# Patient Record
Sex: Female | Born: 1980 | Hispanic: Yes | Marital: Married | Smoking: Never smoker
Health system: Southern US, Community
[De-identification: ages and names within clinical notes are randomized; demographics above are authoritative.]

## PROBLEM LIST (undated history)

## (undated) ENCOUNTER — Inpatient Hospital Stay (HOSPITAL_COMMUNITY): Payer: Self-pay

## (undated) DIAGNOSIS — O24419 Gestational diabetes mellitus in pregnancy, unspecified control: Secondary | ICD-10-CM

## (undated) DIAGNOSIS — R7303 Prediabetes: Secondary | ICD-10-CM

## (undated) DIAGNOSIS — G43909 Migraine, unspecified, not intractable, without status migrainosus: Secondary | ICD-10-CM

## (undated) HISTORY — PX: NO PAST SURGERIES: SHX2092

## (undated) HISTORY — DX: Migraine, unspecified, not intractable, without status migrainosus: G43.909

## (undated) HISTORY — DX: Gestational diabetes mellitus in pregnancy, unspecified control: O24.419

---

## 2001-07-16 ENCOUNTER — Emergency Department (HOSPITAL_COMMUNITY): Admission: EM | Admit: 2001-07-16 | Discharge: 2001-07-16 | Payer: Self-pay | Admitting: Emergency Medicine

## 2001-07-16 ENCOUNTER — Encounter: Payer: Self-pay | Admitting: Emergency Medicine

## 2001-07-22 ENCOUNTER — Emergency Department (HOSPITAL_COMMUNITY): Admission: EM | Admit: 2001-07-22 | Discharge: 2001-07-22 | Payer: Self-pay | Admitting: Emergency Medicine

## 2001-08-14 ENCOUNTER — Other Ambulatory Visit: Admission: RE | Admit: 2001-08-14 | Discharge: 2001-08-14 | Payer: Self-pay | Admitting: Obstetrics and Gynecology

## 2002-08-02 ENCOUNTER — Emergency Department (HOSPITAL_COMMUNITY): Admission: EM | Admit: 2002-08-02 | Discharge: 2002-08-03 | Payer: Self-pay

## 2002-08-03 ENCOUNTER — Encounter: Payer: Self-pay | Admitting: Emergency Medicine

## 2007-05-07 ENCOUNTER — Emergency Department (HOSPITAL_COMMUNITY): Admission: EM | Admit: 2007-05-07 | Discharge: 2007-05-07 | Payer: Self-pay | Admitting: Emergency Medicine

## 2008-10-20 ENCOUNTER — Inpatient Hospital Stay (HOSPITAL_COMMUNITY): Admission: AD | Admit: 2008-10-20 | Discharge: 2008-10-20 | Payer: Self-pay | Admitting: Obstetrics and Gynecology

## 2008-11-05 ENCOUNTER — Ambulatory Visit (HOSPITAL_COMMUNITY): Admission: RE | Admit: 2008-11-05 | Discharge: 2008-11-05 | Payer: Self-pay | Admitting: Obstetrics & Gynecology

## 2009-02-15 ENCOUNTER — Inpatient Hospital Stay (HOSPITAL_COMMUNITY): Admission: AD | Admit: 2009-02-15 | Discharge: 2009-02-15 | Payer: Self-pay | Admitting: Obstetrics and Gynecology

## 2009-03-03 ENCOUNTER — Inpatient Hospital Stay (HOSPITAL_COMMUNITY): Admission: AD | Admit: 2009-03-03 | Discharge: 2009-03-03 | Payer: Self-pay | Admitting: Obstetrics & Gynecology

## 2009-03-04 ENCOUNTER — Ambulatory Visit: Payer: Self-pay | Admitting: Physician Assistant

## 2009-03-04 ENCOUNTER — Inpatient Hospital Stay (HOSPITAL_COMMUNITY): Admission: AD | Admit: 2009-03-04 | Discharge: 2009-03-06 | Payer: Self-pay | Admitting: Obstetrics & Gynecology

## 2009-04-01 ENCOUNTER — Inpatient Hospital Stay (HOSPITAL_COMMUNITY): Admission: AD | Admit: 2009-04-01 | Discharge: 2009-04-01 | Payer: Self-pay | Admitting: Obstetrics and Gynecology

## 2010-04-13 LAB — CBC
HCT: 36.6 % (ref 36.0–46.0)
Hemoglobin: 12.4 g/dL (ref 12.0–15.0)
Hemoglobin: 9.7 g/dL — ABNORMAL LOW (ref 12.0–15.0)
MCHC: 33.9 g/dL (ref 30.0–36.0)
MCHC: 34.1 g/dL (ref 30.0–36.0)
MCV: 94.9 fL (ref 78.0–100.0)
Platelets: 222 10*3/uL (ref 150–400)
RBC: 2.98 MIL/uL — ABNORMAL LOW (ref 3.87–5.11)
RBC: 3.85 MIL/uL — ABNORMAL LOW (ref 3.87–5.11)
RDW: 13.4 % (ref 11.5–15.5)
WBC: 14 10*3/uL — ABNORMAL HIGH (ref 4.0–10.5)
WBC: 16.5 10*3/uL — ABNORMAL HIGH (ref 4.0–10.5)

## 2010-04-14 ENCOUNTER — Emergency Department (HOSPITAL_COMMUNITY)
Admission: EM | Admit: 2010-04-14 | Discharge: 2010-04-14 | Discharge status: Against Medical Advice | Payer: Self-pay | Attending: Emergency Medicine | Admitting: Emergency Medicine

## 2010-04-14 DIAGNOSIS — R1110 Vomiting, unspecified: Secondary | ICD-10-CM | POA: Insufficient documentation

## 2010-04-14 DIAGNOSIS — R109 Unspecified abdominal pain: Secondary | ICD-10-CM | POA: Insufficient documentation

## 2010-04-14 DIAGNOSIS — R51 Headache: Secondary | ICD-10-CM | POA: Insufficient documentation

## 2010-04-29 LAB — WET PREP, GENITAL
Clue Cells Wet Prep HPF POC: NONE SEEN
Trich, Wet Prep: NONE SEEN

## 2010-04-29 LAB — GC/CHLAMYDIA PROBE AMP, GENITAL
Chlamydia, DNA Probe: NEGATIVE
GC Probe Amp, Genital: NEGATIVE

## 2011-01-24 NOTE — L&D Delivery Note (Signed)
Delivery Note At 1:02 PM a viable female was delivered via Vaginal, Spontaneous Delivery (Presentation: Left Occiput Anterior).  APGAR: 8, 9; weight is pending .   Placenta status: Intact, Spontaneous.  Cord:  with the following complications: None.    Anesthesia: None  Episiotomy: None Est. Blood Loss (mL): 500  Mom to postpartum.  Baby to nursery-stable.  Governor Specking 01/18/2012, 1:17 PM    Attended delivery Agree with note No difficulty with shoulders  Wynelle Bourgeois CNM

## 2011-06-22 ENCOUNTER — Inpatient Hospital Stay (HOSPITAL_COMMUNITY): Payer: Self-pay

## 2011-06-22 ENCOUNTER — Inpatient Hospital Stay (HOSPITAL_COMMUNITY)
Admission: AD | Admit: 2011-06-22 | Discharge: 2011-06-22 | Disposition: A | Discharge status: Home / Self Care | Payer: Self-pay | Source: Ambulatory Visit | Attending: Obstetrics and Gynecology | Admitting: Obstetrics and Gynecology

## 2011-06-22 ENCOUNTER — Encounter (HOSPITAL_COMMUNITY): Payer: Self-pay | Admitting: *Deleted

## 2011-06-22 DIAGNOSIS — O210 Mild hyperemesis gravidarum: Secondary | ICD-10-CM | POA: Insufficient documentation

## 2011-06-22 DIAGNOSIS — N760 Acute vaginitis: Secondary | ICD-10-CM | POA: Insufficient documentation

## 2011-06-22 DIAGNOSIS — B9689 Other specified bacterial agents as the cause of diseases classified elsewhere: Secondary | ICD-10-CM | POA: Insufficient documentation

## 2011-06-22 DIAGNOSIS — O2390 Unspecified genitourinary tract infection in pregnancy, unspecified trimester: Secondary | ICD-10-CM | POA: Insufficient documentation

## 2011-06-22 DIAGNOSIS — O219 Vomiting of pregnancy, unspecified: Secondary | ICD-10-CM

## 2011-06-22 DIAGNOSIS — A499 Bacterial infection, unspecified: Secondary | ICD-10-CM | POA: Insufficient documentation

## 2011-06-22 LAB — AMNISURE RUPTURE OF MEMBRANE (ROM) NOT AT ARMC: Amnisure ROM: NEGATIVE

## 2011-06-22 LAB — URINALYSIS, ROUTINE W REFLEX MICROSCOPIC
Bilirubin Urine: NEGATIVE
Ketones, ur: 15 mg/dL — AB
Nitrite: NEGATIVE
Protein, ur: NEGATIVE mg/dL
Specific Gravity, Urine: >1.03 — ABNORMAL HIGH (ref 1.005–1.030)
Urobilinogen, UA: 0.2 mg/dL (ref 0.0–1.0)

## 2011-06-22 LAB — WET PREP, GENITAL
Trich, Wet Prep: NONE SEEN
Yeast Wet Prep HPF POC: NONE SEEN

## 2011-06-22 LAB — URINE MICROSCOPIC-ADD ON

## 2011-06-22 MED ORDER — ACETAMINOPHEN 500 MG PO TABS
500.0000 mg | ORAL_TABLET | Freq: Once | ORAL | Status: AC
  Administered 2011-06-22: 500 mg via ORAL
  Filled 2011-06-22: qty 1

## 2011-06-22 MED ORDER — ONDANSETRON HCL 4 MG PO TABS
8.0000 mg | ORAL_TABLET | Freq: Once | ORAL | Status: AC
  Administered 2011-06-22: 8 mg via ORAL
  Filled 2011-06-22: qty 2

## 2011-06-22 MED ORDER — ONDANSETRON HCL 8 MG PO TABS: 8.0000 mg | ORAL_TABLET | Freq: Three times a day (TID) | ORAL | Status: AC | PRN

## 2011-06-22 MED ORDER — FLAGYL 500 MG PO TABS: 500.0000 mg | ORAL_TABLET | Freq: Two times a day (BID) | ORAL | Status: AC

## 2011-06-22 NOTE — MAU Note (Signed)
Pt to US via wheelchair.

## 2011-06-22 NOTE — MAU Provider Note (Signed)
History     CSN: [id number]  Arrival date and time: 06/22/11 0010   First Provider Initiated Contact with Patient 06/22/11 0110      Chief Complaint  Patient presents with  . Emesis During Pregnancy  . Headache  . Vaginal Discharge   HPI  Pt here with report of worsening of vomiting today.  N&V throughout the pregnancy, but today has vomited 10-15x.  Feeling fatigued x 2 days.  Denies fever, body aches, or chills.  +headache today.  Frontal headache, rated 7/10.  Unable to eat due to nausea and vomiting.  Also reports vaginal discharge that started today.  Discharge is described as clear, mucus like discharge that would come out with vomiting.  Denies vaginal itching or odor.  Completed initial meeting with [facility].  Past Medical History  Diagnosis Date  . No pertinent past medical history     History reviewed. No pertinent past surgical history.  History reviewed. No pertinent family history.  History  Substance Use Topics  . Smoking status: Never Smoker   . Smokeless tobacco: Not on file  . Alcohol Use:     Allergies: No Known Allergies  Prescriptions prior to admission  Medication Sig Dispense Refill  . acetaminophen (TYLENOL) 325 MG tablet Take 325 mg by mouth every 6 (six) hours as needed. pain      . Prenatal Vit-Fe Fumarate-FA (PRENATAL MULTIVITAMIN) TABS Take 1 tablet by mouth at bedtime.        ROS Physical Exam   Blood pressure 123/76, pulse 76, temperature 98.9 F (37.2 C), temperature source Oral, resp. rate 16.  Physical Exam  Constitutional: She is oriented to person, place, and time. She appears well-developed and well-nourished. No distress.  HENT:  Head: Normocephalic.  Neck: Normal range of motion. Neck supple.  Cardiovascular: Normal rate, regular rhythm and normal heart sounds.   Respiratory: Effort normal and breath sounds normal. No respiratory distress.  GI: Soft. There is no tenderness. There is no CVA tenderness.  Genitourinary: Uterus  is enlarged. Cervix exhibits no motion tenderness and no discharge. Vaginal discharge (mucus; clear ) found.       Cervix - closed  Musculoskeletal: Normal range of motion.  Neurological: She is alert and oriented to person, place, and time.  Skin: Skin is warm and dry.  Psychiatric: She has a normal mood and affect.   Fern - positive MAU Course  Procedures  Results for orders placed during the hospital encounter of 06/22/11 (from the past 24 hour(s))  URINALYSIS, ROUTINE W REFLEX MICROSCOPIC     Status: Abnormal   Collection Time   06/22/11 12:20 AM      Component Value Range   Color, Urine YELLOW  YELLOW    APPearance CLEAR  CLEAR    Specific Gravity, Urine >1.030 (*) 1.005 - 1.030    pH 6.0  5.0 - 8.0    Glucose, UA NEGATIVE  NEGATIVE (mg/dL)   Hgb urine dipstick TRACE (*) NEGATIVE    Bilirubin Urine NEGATIVE  NEGATIVE    Ketones, ur 15 (*) NEGATIVE (mg/dL)   Protein, ur NEGATIVE  NEGATIVE (mg/dL)   Urobilinogen, UA 0.2  0.0 - 1.0 (mg/dL)   Nitrite NEGATIVE  NEGATIVE    Leukocytes, UA NEGATIVE  NEGATIVE   URINE MICROSCOPIC-ADD ON     Status: Abnormal   Collection Time   06/22/11 12:20 AM      Component Value Range   Squamous Epithelial / LPF RARE  RARE    WBC,  UA 0-2  <3 (WBC/hpf)   RBC / HPF 0-2  <3 (RBC/hpf)   Bacteria, UA FEW (*) RARE   AMNISURE RUPTURE OF MEMBRANE (ROM)     Status: Normal   Collection Time   06/22/11  2:20 AM      Component Value Range   Amnisure ROM NEGATIVE    WET PREP, GENITAL     Status: Abnormal   Collection Time   06/22/11  2:20 AM      Component Value Range   Yeast Wet Prep HPF POC NONE SEEN  NONE SEEN    Trich, Wet Prep NONE SEEN  NONE SEEN    Clue Cells Wet Prep HPF POC FEW (*) NONE SEEN    WBC, Wet Prep HPF POC FEW (*) NONE SEEN    Ultrasound: Single intrauterine pregnancy. Estimated gestational age by crown-  rump length is 9 weeks 0 days.  FHR 174.  Improvement in headache and nausea with Tylenol and Zofran.  Assessment and Plan    Bacterial Vaginosis Nausea and Vomiting in Pregnancy  Plan: DC to home SAB precautions RX Zofran Take Tylenol for headache Keep scheduled appointment.  [facility] 06/22/2011, 1:13 AM

## 2011-06-22 NOTE — MAU Note (Signed)
Pt LMP 03/10/2011, G2 P1  reports vomiting x 10 today with headache.  Pt having vaginal discharge that is clear mucous.

## 2011-06-23 LAB — GC/CHLAMYDIA PROBE AMP, GENITAL: GC Probe Amp, Genital: NEGATIVE

## 2011-07-03 NOTE — MAU Provider Note (Signed)
Attestation of Attending Supervision of Advanced Practitioner: Evaluation and management procedures were performed by the PA/NP/CNM/OB Fellow under my supervision/collaboration. Chart reviewed and agree with management and plan.  Anissa Abbs V 07/03/2011 1:51 PM

## 2011-07-26 ENCOUNTER — Encounter (HOSPITAL_COMMUNITY): Payer: Self-pay

## 2011-07-26 ENCOUNTER — Inpatient Hospital Stay (HOSPITAL_COMMUNITY)
Admission: AD | Admit: 2011-07-26 | Discharge: 2011-07-26 | Disposition: A | Discharge status: Home / Self Care | Payer: Self-pay | Source: Ambulatory Visit | Attending: Obstetrics & Gynecology | Admitting: Obstetrics & Gynecology

## 2011-07-26 DIAGNOSIS — O26899 Other specified pregnancy related conditions, unspecified trimester: Secondary | ICD-10-CM

## 2011-07-26 DIAGNOSIS — O99891 Other specified diseases and conditions complicating pregnancy: Secondary | ICD-10-CM | POA: Insufficient documentation

## 2011-07-26 DIAGNOSIS — R109 Unspecified abdominal pain: Secondary | ICD-10-CM | POA: Insufficient documentation

## 2011-07-26 DIAGNOSIS — R51 Headache: Secondary | ICD-10-CM | POA: Insufficient documentation

## 2011-07-26 LAB — URINALYSIS, ROUTINE W REFLEX MICROSCOPIC
Bilirubin Urine: NEGATIVE
Glucose, UA: NEGATIVE mg/dL
Hgb urine dipstick: NEGATIVE
Protein, ur: NEGATIVE mg/dL
Urobilinogen, UA: 0.2 mg/dL (ref 0.0–1.0)

## 2011-07-26 MED ORDER — BUTALBITAL-APAP-CAFFEINE 50-325-40 MG PO TABS: 1.0000 | ORAL_TABLET | Freq: Four times a day (QID) | ORAL | Status: DC | PRN

## 2011-07-26 NOTE — MAU Provider Note (Signed)
  History     CSN: [id number]  Arrival date and time: 07/26/11 1138   First Provider Initiated Contact with Patient 07/26/11 1248      Chief Complaint  Patient presents with  . Headache  . Abdominal Pain   HPI Alexandra Ford is 31 y.o. G2P1001 [redacted]w[redacted]d weeks presenting with history of a headache lasting 2 weeks.  She states it has progressively worsened.  Denies nausea, vomiting.  She does report photophobia.  She tried tylenol a "few times" but was concerned the baby would become addicted to the medication.  She has a remote hx of migraines.      Past Medical History  Diagnosis Date  . No pertinent past medical history     History reviewed. No pertinent past surgical history.  History reviewed. No pertinent family history.  History  Substance Use Topics  . Smoking status: Never Smoker   . Smokeless tobacco: Not on file  . Alcohol Use:     Allergies: No Known Allergies  Prescriptions prior to admission  Medication Sig Dispense Refill  . acetaminophen (TYLENOL) 325 MG tablet Take 325 mg by mouth every 6 (six) hours as needed. pain      . Prenatal Vit-Fe Fumarate-FA (PRENATAL MULTIVITAMIN) TABS Take 1 tablet by mouth at bedtime.        Review of Systems  Constitutional: Negative for fever and chills.  Eyes: Positive for photophobia.  Respiratory: Negative.   Cardiovascular: Negative.   Gastrointestinal: Negative for nausea, vomiting and abdominal pain.  Genitourinary: Negative.   Neurological: Positive for headaches. Negative for dizziness, tingling, sensory change, speech change, focal weakness and weakness.   Physical Exam   Blood pressure 109/75, pulse 77, temperature 99.2 F (37.3 C), temperature source Oral, resp. rate 16, height 5' 1.75" (1.568 m), weight 77.928 kg (171 lb 12.8 oz), last menstrual period 03/10/2011, SpO2 100.00%.  Physical Exam  Constitutional: She is oriented to person, place, and time. She appears well-developed and well-nourished.  Distressed: uncomfortable.  HENT:  Head: Normocephalic.  Neck: Normal range of motion.  Cardiovascular: Normal rate.   Respiratory: Effort normal.  GI: Soft. There is no tenderness.  Genitourinary:       Not indicated  Neurological: She is alert and oriented to person, place, and time. She has normal strength. No cranial nerve deficit. Coordination and gait normal.    MAU Course  Procedures  MDM Discussed MSE with Dr. Debroah Loop.  BP is with in normal limits.Patient is driving and has no other way home.  We are unable to give her sedating medications because of this.  Eda, translator and I discussed this with the patient and she is willing to get RX filled and take at home.  She may return if sxs are not improved.    Assessment and Plan  A:  Headache in pregnancy at [redacted] weeks gestation  P:  Rx for fioricet given to patient.       To return if sxs persist after treatment    Keep appt for next week to begin prenatal care.   Dowell Hoon,EVE M 07/26/2011, 1:26 PM

## 2011-07-26 NOTE — MAU Note (Signed)
Pt/Eda, spanish translator state pt has had h/a x2 months, comes moreso at nights. Last night was up all night crying r/t pain.

## 2011-07-26 NOTE — MAU Note (Signed)
Pt has no one who is able to pick her up if she is given pain medication for headache.

## 2011-07-26 NOTE — MAU Note (Signed)
Patient states she started having a very bad headache last night that continues.States she has been having headaches for about 2 months.Has been taking Tylenol without relief.  Has been having left lower abdominal pain. Denies any bleeding or vaginal discharge.

## 2011-07-26 NOTE — Progress Notes (Signed)
[facility] Department of Clinical Social Work Documentation of Interpretation   I assisted __Donna RN_________________ with interpretation of ____questions__________________ for this patient. 

## 2011-07-28 NOTE — MAU Provider Note (Signed)
Agree with note. 

## 2011-08-01 ENCOUNTER — Other Ambulatory Visit (HOSPITAL_COMMUNITY): Payer: Self-pay | Admitting: Nurse Practitioner

## 2011-08-01 DIAGNOSIS — Z3689 Encounter for other specified antenatal screening: Secondary | ICD-10-CM

## 2011-08-01 LAB — OB RESULTS CONSOLE HIV ANTIBODY (ROUTINE TESTING): HIV: NONREACTIVE

## 2011-08-01 LAB — GLUCOSE TOLERANCE, 1 HOUR (50G) W/O FASTING: Glucose, 1 Hour GTT: 138

## 2011-08-01 LAB — OB RESULTS CONSOLE RPR: RPR: NONREACTIVE

## 2011-08-01 LAB — OB RESULTS CONSOLE RUBELLA ANTIBODY, IGM: Rubella: IMMUNE

## 2011-08-01 LAB — OB RESULTS CONSOLE HEPATITIS B SURFACE ANTIGEN: Hepatitis B Surface Ag: NEGATIVE

## 2011-08-01 LAB — OB RESULTS CONSOLE PLATELET COUNT: Platelets: 272 10*3/uL

## 2011-08-01 LAB — OB RESULTS CONSOLE ANTIBODY SCREEN: Antibody Screen: NEGATIVE

## 2011-08-01 LAB — OB RESULTS CONSOLE VARICELLA ZOSTER ANTIBODY, IGG: Varicella: NON-IMMUNE/NOT IMMUNE

## 2011-08-01 LAB — OB RESULTS CONSOLE GC/CHLAMYDIA: Gonorrhea: NEGATIVE

## 2011-08-01 LAB — OB RESULTS CONSOLE ABO/RH

## 2011-08-03 LAB — GLUCOSE TOLERANCE, 3 HOURS
Glucose, GTT - 1 Hour: 182 mg/dL (ref ?–200)
Glucose, GTT - Fasting: 84 mg/dL (ref 80–110)

## 2011-08-10 DIAGNOSIS — O24419 Gestational diabetes mellitus in pregnancy, unspecified control: Secondary | ICD-10-CM

## 2011-08-10 DIAGNOSIS — O0990 Supervision of high risk pregnancy, unspecified, unspecified trimester: Secondary | ICD-10-CM

## 2011-08-14 ENCOUNTER — Ambulatory Visit: Payer: Self-pay | Admitting: Obstetrics & Gynecology

## 2011-08-14 ENCOUNTER — Encounter: Payer: Medicaid Other | Attending: Obstetrics & Gynecology | Admitting: Dietician

## 2011-08-14 DIAGNOSIS — Z713 Dietary counseling and surveillance: Secondary | ICD-10-CM | POA: Insufficient documentation

## 2011-08-14 DIAGNOSIS — O99810 Abnormal glucose complicating pregnancy: Secondary | ICD-10-CM | POA: Insufficient documentation

## 2011-08-14 LAB — POCT URINALYSIS DIP (DEVICE)
Glucose, UA: NEGATIVE mg/dL
Leukocytes, UA: NEGATIVE
Protein, ur: NEGATIVE mg/dL
Urobilinogen, UA: 0.2 mg/dL (ref 0.0–1.0)

## 2011-08-14 NOTE — Progress Notes (Signed)
Nutrition Note: (1st visit-RD/CDE only) Pt seen for GDM diet education; obesity. Pt reports current wt of 171#, gain of 4# @ [redacted]w[redacted]d- plots within normal limits.  Pt given verbal and written GDM diet education.  Also given example of ideal eating times.  Pt reports typical intake varies due to work schedule.   NO food allergies and no N/V reported.  Agrees to follow GDM diet, including 3 meals and 3 snacks and proper CHO/protein combination.  Disc wt gain goals of 11-20# and incorporating physical activity into daily routine.  Pt has WIC appt 08/25/11. Follow up if referred.  Cy Blamer, RD

## 2011-08-14 NOTE — Progress Notes (Signed)
Diabetes Education:  Completed review of the glucose testing for GDM with the assistance of Vickie the Spanish interpreter.  Provided a True Track meter kit Lot: [id number]  Exp: 2015  1 Box lancets Lot: [id number]  Exp: 2015/06/26 and 1 box strips Lot: [id number]  Exp: 2013/09/25.  On completion of return demonstration, blood glucose fasting was 94 mg/dl.  To see Randal Buba for diet.  Maggie Anjolina Byrer, RN, RD, CDE.

## 2011-08-15 DIAGNOSIS — O0990 Supervision of high risk pregnancy, unspecified, unspecified trimester: Secondary | ICD-10-CM | POA: Insufficient documentation

## 2011-08-25 ENCOUNTER — Ambulatory Visit (HOSPITAL_COMMUNITY)
Admission: RE | Admit: 2011-08-25 | Discharge: 2011-08-25 | Disposition: A | Discharge status: Home / Self Care | Payer: Medicaid Other | Source: Ambulatory Visit | Attending: Nurse Practitioner | Admitting: Nurse Practitioner

## 2011-08-25 DIAGNOSIS — Z3689 Encounter for other specified antenatal screening: Secondary | ICD-10-CM

## 2011-08-25 DIAGNOSIS — Z1389 Encounter for screening for other disorder: Secondary | ICD-10-CM | POA: Insufficient documentation

## 2011-08-25 DIAGNOSIS — Z363 Encounter for antenatal screening for malformations: Secondary | ICD-10-CM | POA: Insufficient documentation

## 2011-08-25 DIAGNOSIS — O358XX0 Maternal care for other (suspected) fetal abnormality and damage, not applicable or unspecified: Secondary | ICD-10-CM | POA: Insufficient documentation

## 2011-08-28 ENCOUNTER — Ambulatory Visit: Payer: Self-pay | Admitting: Physician Assistant

## 2011-08-28 ENCOUNTER — Encounter: Payer: Self-pay | Admitting: Physician Assistant

## 2011-08-28 ENCOUNTER — Encounter: Payer: Medicaid Other | Attending: Obstetrics & Gynecology | Admitting: Dietician

## 2011-08-28 VITALS — BP 92/57 | Temp 98.1°F | Wt 164.0 lb

## 2011-08-28 DIAGNOSIS — O0990 Supervision of high risk pregnancy, unspecified, unspecified trimester: Secondary | ICD-10-CM

## 2011-08-28 DIAGNOSIS — O99810 Abnormal glucose complicating pregnancy: Secondary | ICD-10-CM | POA: Insufficient documentation

## 2011-08-28 DIAGNOSIS — IMO0002 Reserved for concepts with insufficient information to code with codable children: Secondary | ICD-10-CM | POA: Insufficient documentation

## 2011-08-28 DIAGNOSIS — Z713 Dietary counseling and surveillance: Secondary | ICD-10-CM | POA: Insufficient documentation

## 2011-08-28 DIAGNOSIS — O24419 Gestational diabetes mellitus in pregnancy, unspecified control: Secondary | ICD-10-CM

## 2011-08-28 LAB — POCT URINALYSIS DIP (DEVICE)
Bilirubin Urine: NEGATIVE
Glucose, UA: NEGATIVE mg/dL
Specific Gravity, Urine: 1.02 (ref 1.005–1.030)
Urobilinogen, UA: 0.2 mg/dL (ref 0.0–1.0)

## 2011-08-28 NOTE — Patient Instructions (Signed)
Pregnancy - Second Trimester The second trimester of pregnancy (3 to 6 months) is a period of rapid growth for you and your baby. At the end of the sixth month, your baby is about 9 inches long and weighs 1 1/2 pounds. You will begin to feel the baby move between 18 and 20 weeks of the pregnancy. This is called quickening. Weight gain is faster. A clear fluid (colostrum) may leak out of your breasts. You may feel small contractions of the womb (uterus). This is known as false labor or Braxton-Hicks contractions. This is like a practice for labor when the baby is ready to be born. Usually, the problems with morning sickness have usually passed by the end of your first trimester. Some women develop small dark blotches (called cholasma, mask of pregnancy) on their face that usually goes away after the baby is born. Exposure to the sun makes the blotches worse. Acne may also develop in some pregnant women and pregnant women who have acne, may find that it goes away. PRENATAL EXAMS  Blood work may continue to be done during prenatal exams. These tests are done to check on your health and the probable health of your baby. Blood work is used to follow your blood levels (hemoglobin). Anemia (low hemoglobin) is common during pregnancy. Iron and vitamins are given to help prevent this. You will also be checked for diabetes between 24 and 28 weeks of the pregnancy. Some of the previous blood tests may be repeated.   The size of the uterus is measured during each visit. This is to make sure that the baby is continuing to grow properly according to the dates of the pregnancy.   Your blood pressure is checked every prenatal visit. This is to make sure you are not getting toxemia.   Your urine is checked to make sure you do not have an infection, diabetes or protein in the urine.   Your weight is checked often to make sure gains are happening at the suggested rate. This is to ensure that both you and your baby are  growing normally.   Sometimes, an ultrasound is performed to confirm the proper growth and development of the baby. This is a test which bounces harmless sound waves off the baby so your caregiver can more accurately determine due dates.  Sometimes, a specialized test is done on the amniotic fluid surrounding the baby. This test is called an amniocentesis. The amniotic fluid is obtained by sticking a needle into the belly (abdomen). This is done to check the chromosomes in instances where there is a concern about possible genetic problems with the baby. It is also sometimes done near the end of pregnancy if an early delivery is required. In this case, it is done to help make sure the baby's lungs are mature enough for the baby to live outside of the womb. CHANGES OCCURING IN THE SECOND TRIMESTER OF PREGNANCY Your body goes through many changes during pregnancy. They vary from person to person. Talk to your caregiver about changes you notice that you are concerned about.  During the second trimester, you will likely have an increase in your appetite. It is normal to have cravings for certain foods. This varies from person to person and pregnancy to pregnancy.   Your lower abdomen will begin to bulge.   You may have to urinate more often because the uterus and baby are pressing on your bladder. It is also common to get more bladder infections during pregnancy (  pain with urination). You can help this by drinking lots of fluids and emptying your bladder before and after intercourse.   You may begin to get stretch marks on your hips, abdomen, and breasts. These are normal changes in the body during pregnancy. There are no exercises or medications to take that prevent this change.   You may begin to develop swollen and bulging veins (varicose veins) in your legs. Wearing support hose, elevating your feet for 15 minutes, 3 to 4 times a day and limiting salt in your diet helps lessen the problem.    Heartburn may develop as the uterus grows and pushes up against the stomach. Antacids recommended by your caregiver helps with this problem. Also, eating smaller meals 4 to 5 times a day helps.   Constipation can be treated with a stool softener or adding bulk to your diet. Drinking lots of fluids, vegetables, fruits, and whole grains are helpful.   Exercising is also helpful. If you have been very active up until your pregnancy, most of these activities can be continued during your pregnancy. If you have been less active, it is helpful to start an exercise program such as walking.   Hemorrhoids (varicose veins in the rectum) may develop at the end of the second trimester. Warm sitz baths and hemorrhoid cream recommended by your caregiver helps hemorrhoid problems.   Backaches may develop during this time of your pregnancy. Avoid heavy lifting, wear low heal shoes and practice good posture to help with backache problems.   Some pregnant women develop tingling and numbness of their hand and fingers because of swelling and tightening of ligaments in the wrist (carpel tunnel syndrome). This goes away after the baby is born.   As your breasts enlarge, you may have to get a bigger bra. Get a comfortable, cotton, support bra. Do not get a nursing bra until the last month of the pregnancy if you will be nursing the baby.   You may get a dark line from your belly button to the pubic area called the linea nigra.   You may develop rosy cheeks because of increase blood flow to the face.   You may develop spider looking lines of the face, neck, arms and chest. These go away after the baby is born.  HOME CARE INSTRUCTIONS   It is extremely important to avoid all smoking, herbs, alcohol, and unprescribed drugs during your pregnancy. These chemicals affect the formation and growth of the baby. Avoid these chemicals throughout the pregnancy to ensure the delivery of a healthy infant.   Most of your home  care instructions are the same as suggested for the first trimester of your pregnancy. Keep your caregiver's appointments. Follow your caregiver's instructions regarding medication use, exercise and diet.   During pregnancy, you are providing food for you and your baby. Continue to eat regular, well-balanced meals. Choose foods such as meat, fish, milk and other low fat dairy products, vegetables, fruits, and whole-grain breads and cereals. Your caregiver will tell you of the ideal weight gain.   A physical sexual relationship may be continued up until near the end of pregnancy if there are no other problems. Problems could include early (premature) leaking of amniotic fluid from the membranes, vaginal bleeding, abdominal pain, or other medical or pregnancy problems.   Exercise regularly if there are no restrictions. Check with your caregiver if you are unsure of the safety of some of your exercises. The greatest weight gain will occur in the   last 2 trimesters of pregnancy. Exercise will help you:   Control your weight.   Get you in shape for labor and delivery.   Lose weight after you have the baby.   Wear a good support or jogging bra for breast tenderness during pregnancy. This may help if worn during sleep. Pads or tissues may be used in the bra if you are leaking colostrum.   Do not use hot tubs, steam rooms or saunas throughout the pregnancy.   Wear your seat belt at all times when driving. This protects you and your baby if you are in an accident.   Avoid raw meat, uncooked cheese, cat litter boxes and soil used by cats. These carry germs that can cause birth defects in the baby.   The second trimester is also a good time to visit your dentist for your dental health if this has not been done yet. Getting your teeth cleaned is OK. Use a soft toothbrush. Brush gently during pregnancy.   It is easier to loose urine during pregnancy. Tightening up and strengthening the pelvic muscles will  help with this problem. Practice stopping your urination while you are going to the bathroom. These are the same muscles you need to strengthen. It is also the muscles you would use as if you were trying to stop from passing gas. You can practice tightening these muscles up 10 times a set and repeating this about 3 times per day. Once you know what muscles to tighten up, do not perform these exercises during urination. It is more likely to contribute to an infection by backing up the urine.   Ask for help if you have financial, counseling or nutritional needs during pregnancy. Your caregiver will be able to offer counseling for these needs as well as refer you for other special needs.   Your skin may become oily. If so, wash your face with mild soap, use non-greasy moisturizer and oil or cream based makeup.  MEDICATIONS AND DRUG USE IN PREGNANCY  Take prenatal vitamins as directed. The vitamin should contain 1 milligram of folic acid. Keep all vitamins out of reach of children. Only a couple vitamins or tablets containing iron may be fatal to a baby or young child when ingested.   Avoid use of all medications, including herbs, over-the-counter medications, not prescribed or suggested by your caregiver. Only take over-the-counter or prescription medicines for pain, discomfort, or fever as directed by your caregiver. Do not use aspirin.   Let your caregiver also know about herbs you may be using.   Alcohol is related to a number of birth defects. This includes fetal alcohol syndrome. All alcohol, in any form, should be avoided completely. Smoking will cause low birth rate and premature babies.   Street or illegal drugs are very harmful to the baby. They are absolutely forbidden. A baby born to an addicted mother will be addicted at birth. The baby will go through the same withdrawal an adult does.  SEEK MEDICAL CARE IF:  You have any concerns or worries during your pregnancy. It is better to call with  your questions if you feel they cannot wait, rather than worry about them. SEEK IMMEDIATE MEDICAL CARE IF:   An unexplained oral temperature above 102 F (38.9 C) develops, or as your caregiver suggests.   You have leaking of fluid from the vagina (birth canal). If leaking membranes are suspected, take your temperature and tell your caregiver of this when you call.   There   is vaginal spotting, bleeding, or passing clots. Tell your caregiver of the amount and how many pads are used. Light spotting in pregnancy is common, especially following intercourse.   You develop a bad smelling vaginal discharge with a change in the color from clear to white.   You continue to feel sick to your stomach (nauseated) and have no relief from remedies suggested. You vomit blood or coffee ground-like materials.   You lose more than 2 pounds of weight or gain more than 2 pounds of weight over 1 week, or as suggested by your caregiver.   You notice swelling of your face, hands, feet, or legs.   You get exposed to German measles and have never had them.   You are exposed to fifth disease or chickenpox.   You develop belly (abdominal) pain. Round ligament discomfort is a common non-cancerous (benign) cause of abdominal pain in pregnancy. Your caregiver still must evaluate you.   You develop a bad headache that does not go away.   You develop fever, diarrhea, pain with urination, or shortness of breath.   You develop visual problems, blurry, or double vision.   You fall or are in a car accident or any kind of trauma.   There is mental or physical violence at home.  Document Released: 01/03/2001 Document Revised: 12/29/2010 Document Reviewed: 07/08/2008 [employer] Patient Information 2012 [employer]. 

## 2011-08-28 NOTE — Progress Notes (Signed)
BS overall very good. 4 abnormals values 2PP: 141, 148, 163, 161 after eating too many carbs. States hungry all the time. Nutrition today to discuss increase protein and fiber in meals.

## 2011-08-28 NOTE — Progress Notes (Signed)
P-75 

## 2011-08-28 NOTE — Progress Notes (Incomplete)
Diabetes Education:  Currently, she is not eating the recommended amount of CHO for breakfast and lunch.  She is having 12 gm at breakfast and about 30 gm for lunch.  We reviewed the recommendations with assistance of Dorita the Spanish interpreter.  She is to have more protein and CHO at breakfast, lunch and the dinner meal.  She is getting hungry and over eating at the next meal.  Provided 1 box strips Lot: [id number] Exp: 2013/10/22  Lancets: LOt: [id number]  Wxp 2015/06/26.  Maggie Doranne Schmutz, RN, RD, CDE

## 2011-09-04 ENCOUNTER — Ambulatory Visit (INDEPENDENT_AMBULATORY_CARE_PROVIDER_SITE_OTHER): Payer: Medicaid Other | Admitting: Advanced Practice Midwife

## 2011-09-04 VITALS — BP 97/60 | Temp 98.6°F | Wt 165.4 lb

## 2011-09-04 DIAGNOSIS — O24419 Gestational diabetes mellitus in pregnancy, unspecified control: Secondary | ICD-10-CM

## 2011-09-04 DIAGNOSIS — O99810 Abnormal glucose complicating pregnancy: Secondary | ICD-10-CM

## 2011-09-04 DIAGNOSIS — IMO0002 Reserved for concepts with insufficient information to code with codable children: Secondary | ICD-10-CM

## 2011-09-04 LAB — POCT URINALYSIS DIP (DEVICE)
Bilirubin Urine: NEGATIVE
Glucose, UA: NEGATIVE mg/dL
Hgb urine dipstick: NEGATIVE
Ketones, ur: NEGATIVE mg/dL
Leukocytes, UA: NEGATIVE
Nitrite: NEGATIVE

## 2011-09-04 NOTE — Progress Notes (Signed)
P = 91 C/o pain in the LLQ

## 2011-09-04 NOTE — Progress Notes (Signed)
Fasting 83-121, mostly 90's. PC <124 except for 130, 142. Diarrhea/loose stool once a day x 15 days. No fever, N/V, sick contacts. Discussed dietary changes that may have caused diarrhea. Rec food diary to help improve diet control of blood sugars. Will likely needs meds. Strips and lancets given. 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 Fetal Echo ordered.

## 2011-09-18 ENCOUNTER — Encounter: Payer: Self-pay | Admitting: Physician Assistant

## 2011-09-18 ENCOUNTER — Ambulatory Visit (INDEPENDENT_AMBULATORY_CARE_PROVIDER_SITE_OTHER): Payer: Medicaid Other | Admitting: Physician Assistant

## 2011-09-18 ENCOUNTER — Encounter: Payer: Medicaid Other | Admitting: Dietician

## 2011-09-18 VITALS — BP 102/63 | Temp 97.3°F | Wt 163.2 lb

## 2011-09-18 DIAGNOSIS — O24419 Gestational diabetes mellitus in pregnancy, unspecified control: Secondary | ICD-10-CM

## 2011-09-18 DIAGNOSIS — O99810 Abnormal glucose complicating pregnancy: Secondary | ICD-10-CM

## 2011-09-18 LAB — POCT URINALYSIS DIP (DEVICE)
Bilirubin Urine: NEGATIVE
Specific Gravity, Urine: 1.025 (ref 1.005–1.030)
pH: 5.5 (ref 5.0–8.0)

## 2011-09-18 NOTE — Progress Notes (Signed)
P = 80 Occasional pain on left abdomen

## 2011-09-18 NOTE — Progress Notes (Signed)
Diabetes Education:  Provided one box of strips, [id number] Exp:2013/11/22 Maggie Ramsey Guadamuz, RN, RD, CDE

## 2011-09-18 NOTE — Patient Instructions (Signed)
Dieta para diabticos de 2000 caloras (2000 Calorie Diabetic Diet) Esta dieta para diabticos limita las caloras a 2000 por da. Si sigue esta dieta y un plan de comidas saludable podr mejorar su estado general de salud. Sirve para Medical sales representative de International aid/development worker. Disminuye la presin arterial y Print production planner.  PORCIONES La medicin de los alimentos y el tamao de las porciones lo ayudarn a Scientist, physiological cantidad exacta de comida que debe ingerir. La lista que sigue le mostrar el tamao de algunas porciones comunes.   1 onza.................4 dados apilados   3 onzas..............Marland KitchenUn mazo de cartas   1 cucharadita...Marland KitchenMarland KitchenLa punta de un dedo pequeo   1 cucharada.......Marland KitchenUn dedo   2 cucharadas....Marland KitchenMarland KitchenUna pelota de golf    taza..................la mitad de un puo   1 taza..................Marland KitchenUn puo  GUA PARA LA ELECCIN DE LOS ALIMENTOS El objetivo de esta dieta es consumir alimentos variados y Film/video editor la cantidad de caloras a 2000 por Futures trader. Esto puede lograrse eligiendo los alimentos que son bajos en caloras y en grasas. La dieta tambin aconseja consumir porciones pequeas de alimento con frecuencia. Esto ayuda a [employer] de azcar en la sangre de modo que no suba o baje demasiado. Cada alimento o colacin deber incluir una protena para ayudarlo a sentirse ms satisfecho y Tree surgeon su nivel de Production assistant, radio. Consuma aproximadamente la misma cantidad de alimentos a la [employer]. Esto incluye fines de [address], [address]  [address] de viaje y [address] [place] [address] del [place]. Coma cada 4 a 5 horas, y aada una colacin entre comidas, si lo desea.  Por ejemplo, un plan diario de comidas incluye el desayuno, una colacin por la maana, almuerzo, cena y [place] colacin por la noche. En las comidas saludables y las colaciones puede incluir una variedad de [employer] se incluyen granos enteros, vegetales, frutas, carnes [place], aves, pescado y productos lcteos. Cuando planee sus  comidas, seleccione una variedad de alimentos. Elija entre panes y fculas, vegetales, frutas, productos lcteos y carnes o protenas. Abajo se enumeran los ejemplos para cada grupo, con las porciones sugeridas. Utilice tazas y cucharas para familiarizarse con el tamao de las porciones. Panes y fculas Cada porcin son 15 gramos de carbohidratos  1 rebanada de pan    de rosquilla    de taza o 1 taza de cereal fro (sin azcar).    taza de cereales calientes, pastas cocidas o pur de papas   1 papa pequea (del tamao de un ratn de ordenador)   ? taza de pasta o arroz cocido   1 bollito tostado tipo ingls   1 taza de caldo   3 tazas de palomitas de maz   4 a 6 rebanadas de pan integral    taza de porotos, arvejas o maz  Vegetales Cada porcin son 5 gramos de carbohidratos   taza de vegetales cocidos   1 taza de vegetales crudos    taza de jugo de tomates  Frutas Cada porcin son 15 gramos de carbohidratos  1 manzana, banana o naranja pequea   1  taza de sanda o frutillas    taza de compota de Psychologist, educational (sin agregado de International aid/development worker)   2 cucharadas de pasas de uva    banana    taza de fruta enlatada sin azcar    taza de jugo de frutas sin azcar  Lcteos Cada porcin son 12 a 15 gramos de carbohidratos  1 taza de [employer]   6 onzas de yogurt (endulzado artificialmente)  1 taza de crema   1 taza de leche de soja  Carnes/protenas  1 huevo grande   2 -3 onzas (60 - 80 gr) de carne, ave o pescado.    taza de queso cottage   1 cucharada de [place] de man    taza de tofu   1 oz de queso    taza de atn envasado en agua  EJEMPLO DE DIETA DE 2000 CALORAS Desayuno  1 bollito tostado tipo ingls (2 porciones de carbohidratos)   1 cucharada de queso crema light   1 huevo revuelto    pomelo (1 porcin de carbohidratos)   1 taza de [employer] (1 porcin de carbohidratos)  Colacin de la maana   taza de  yogur Light (1 porcin de carbohidratos)   2 cucharadas de nueces picadas   1 durazno pequeo (1 porcin de carbohidratos)  Almuerzo  [place] de pollo a la parrilla   1 pan de hamburguesa (2 porciones de carbohidratos).   2 oz (60 gr) de pechuga de pollo   1 hoja de lechuga   2 rebanadas de tomate   Mayonesa con bajo contenido de grasa (1 cucharada)   1 taza de bastones de zanahorias   1 taza de apio   1 manzana pequea (1 porcin de carbohidratos)   1 taza de [employer] (1 porcin de carbohidratos)  Colacin de media tarde   taza de queso cottage bajo en grasas   1 taza de frutillas (1 porcin de carbohidratos)  Cena  Fajitas de carne   2 oz (60 gr) de carne   1 tortilla de trigo integral de 20 cm (1,5 carbohidratos)    taza de lechuga cortada en juliana   2 rebanadas de tomate    taza de salsa   2 cucharadas de crema cida baja en contenido graso   ? taza de arroz integral (1 porcin de carbohidratos)   1 naranja pequea (1 porcin de carbohidratos)  Colacin de la noche  4 Triscuits reducidas en grasas (1 porcin de carbohidratos)   1 cucharada de mantequilla de man   12 a 15 uvas (1 porcin de carbohidratos)  PLAN DE COMIDAS Puede utilizar esta hoja para realizar su plan de comidas basndose en las indicaciones para la dieta de 2000 caloras para el diabtico Si [employer] plan para ayudar a Scientist, physiological glucosa en sangre, podr [employer] carbohidratos de los alimentos (lcteos, fculas y frutas). Seleccione una variedad de alimentos frescos de colores y sabores variados. La cantidad total de carbohidratos en los alimentos o colaciones es ms importante que asegurarse incluir todos los grupos alimenticios cada vez que come. Puede elegir varios de los siguientes alimentos para sus comidas diarias:  11 porciones de fculas   4 porciones de vegetales   3 porciones de frutas   3 porcin de lcteos   8 onzas de carne   Hasta 6  porciones de grasas  El dietista podr utilizar esta hoja para ayudarlo a decidir cuntas porciones y qu tipos de alimentos son los adecuados para usted. DESAYUNO Grupo de alimentos y porciones / Alimento elegido Fculas_________________________________________________________ Lcteos_________________________________________________________ Lou Miner __________________________________________________________ Carnes__________________________________________________________ Grasas__________________________________________________________ Lorin Mercy de alimentos y porciones / Alimento elegido Fcula________________________________________________________ Charlesetta Ivory _______________________________________________________ Vegetales _____________________________________________________ Lou Miner _________________________________________________________ Lcteos________________________________________________________ Grasas_________________________________________________________ Waldo Laine DE MEDIA TARDE Grupo de alimentos y porciones / Alimento elegido Fcula_________________________________________________________ Carne__________________________________________________________ Fruta__________________________________________________________ [place] de alimentos y porciones / Alimento elegido Fculas________________________________________________________ Carnes_________________________________________________________ Lcteos_________________________________________________________ Rufina Falco _______________________________________________________ Lou Miner ___________________________________________________________ Grasas__________________________________________________________ Waldo Laine DE LA NOCHE Grupo de alimentos y porciones /  Alimento elegido Fruta  ___________________________________________________________ Carnes__________________________________________________________ Fcula___________________________________________________________ Marily Memos Fculas_________________________________________________________ Vegetales _______________________________________________________ Lou Miner __________________________________________________________ Lcteos_________________________________________________________ Carnes__________________________________________________________ Rosalin Hawking __________________________________________________________ Document Released: 04/26/2006 Document Revised: 12/29/2010 ExitCare Patient Information 2012 [place], [place].

## 2011-09-18 NOTE — Progress Notes (Signed)
FBS mostly in the hi 80s, some 90s and low 100, 2pp: 90% <125. Pt reports increased depression secondary to finances and issues with husband. Major concern over medical bills. SW visit today. Is likely going to need glyburide added as hs. Will hold off until next visit.

## 2011-09-28 ENCOUNTER — Ambulatory Visit (INDEPENDENT_AMBULATORY_CARE_PROVIDER_SITE_OTHER): Payer: Self-pay | Admitting: Obstetrics & Gynecology

## 2011-09-28 VITALS — BP 94/59 | Temp 98.1°F | Wt 161.8 lb

## 2011-09-28 DIAGNOSIS — IMO0002 Reserved for concepts with insufficient information to code with codable children: Secondary | ICD-10-CM

## 2011-09-28 DIAGNOSIS — O99810 Abnormal glucose complicating pregnancy: Secondary | ICD-10-CM

## 2011-09-28 DIAGNOSIS — O0990 Supervision of high risk pregnancy, unspecified, unspecified trimester: Secondary | ICD-10-CM

## 2011-09-28 LAB — POCT URINALYSIS DIP (DEVICE)
Glucose, UA: NEGATIVE mg/dL
Hgb urine dipstick: NEGATIVE
Ketones, ur: NEGATIVE mg/dL
Specific Gravity, Urine: 1.025 (ref 1.005–1.030)

## 2011-09-28 NOTE — Addendum Note (Signed)
Addended by: Franchot Mimes on: 09/28/2011 10:42 AM   Modules accepted: Orders

## 2011-09-28 NOTE — Progress Notes (Signed)
Pt complaining of discharge.  Gc/Chlam neg.  Wet prep done.  Fasting 80-105.  2 hr after lunch--111,112,174,92,87,68,  2 hr after lunch WNL;  2 hr after dinner 108,112,101,108,131,109,165,89,173 Pt needs to eat bedtime snack.  Will have her meet with DM educator.

## 2011-09-28 NOTE — Progress Notes (Signed)
Nutrition note: follow-up Pt referred for further GDM diet education. Pt has lost 5.22# @ 102w0d. BS: fasting- 80-105, 2h pp- 68-174.  Pt reports eating 2 meals & 3 snacks/d & is often hungry & tired. Disc importance of eating protein with CHO at meals & snacks & disc different options for bedtime snack & breakfast.  Pt stated she does take PNV. Disc wt gain goals of 11-20# but that if fetal growth is wnl per doctor, then wt gain isn't as impt.  Pt does receive WIC. F/u in 4-6 wks Alexandra Reveal, MS, RD, LDN

## 2011-09-28 NOTE — Progress Notes (Signed)
Pulse- 72  Pressure- lower abd

## 2011-09-28 NOTE — Patient Instructions (Signed)
Parto prematuro  (Preterm Labor) El parto prematuro comienza antes de la semana 37 de embarazo. La duracin de un embarazo normal es de 39 a 41 semanas.  CAUSAS  Generalmente no hay una causa que pueda identificarse. Sin embargo, una de las causas conocidas ms frecuentes son las infecciones. Las infecciones del tero, el cuello, la vagina, el lquido amnitico, la vejiga, los riones y hasta de los pulmones (neumona) pueden hacer que el trabajo de parto se inicie. Otras causas son:   Infecciones urogenitales, como infecciones por hongos y vaginosis bacteriana.   Anormalidades uterinas (forma del tero, sptum uterino, fibromas, hemorragias en la placenta).   Un cuello que ha sido operado y se abre prematuramente.   Malformaciones del beb.   Gestaciones mltiples (mellizos, trillizos y ms).   Ruptura del saco amnitico.  :Otros factores de riesgo del parto prematuro son   Historia previa de parto prematuro.   Ruptura prematura de las membranas.   La placenta cubre la apertura del cuello (placenta previa).   La placenta se separa del tero (abrupcin placentaria).   El cuello es demasiado dbil para contener al beb en el tero (cuello incompetente).   Hay mucho lquido en el saco amnitico (polihidramnios).   Consumo de drogas o hbito de fumar durante el embarazo.   No aumentar de peso lo suficiente durante el embarazo.   Mujeres menores de 18 aos o mayores de 35 aos aos.   Nivel socioeconmico bajo.   Raza afroamericana.  SNTOMAS  Los signos y sntomas son:   Clicos del tipo menstrual   Contracciones con un intervalo entre 30 y 70 segundos, comienzan a ser regulares, se hacen ms frecuentes y se hacen ms intensas y dolorosas.   Contracciones que comienzan en la parte superior del tero y se expanden hacia abajo, hacia la zona inferior del abdomen y la espalda.   Sensacin de presin en la pelvis o dolor en la espalda.   Aparece una secrecin acuosa o  sanguinolenta por la vagina.  DIAGNSTICO  El diagnstico puede confirmarse:   Con un examen vaginal.   Ecografa del cuello.   Muestra (hisopado) de las secreciones crvico-vaginales. Estas muestras se analizan para buscar la presencia de fibronectina fetal. Esta protena que se encuentra en las secreciones del tero y se asocia con el parto prematuro.   Monitoreo fetal  TRATAMIENTO  Segn el tiempo del embarazo y otras circunstancias, el mdico puede indicar reposo en cama. Si es necesario, le indicarn medicamentos para detener las contracciones y apurar la maduracin de los pulmones del feto. Si el trabajo de parto se inicia antes de las 34 semanas de embarazo, se recomienda la hospitalizacin. El tratamiento depende de las condiciones en que se encuentre la madre y el beb.  PREVENCIN  Hay algunas cosas que una madre puede hacer para disminuir el riesgo de trabajo de parto prematuro en futuros embarazos. Una mam puede:   Dejar de fumar.   Mantener un peso saludable y evitar sustancias qumicas y drogas innecesarias.   Controlar todo tipo de infeccin.   Informar al mdico si tiene una historia conocida de parto prematuro.  Document Released: 04/18/2007 Document Revised: 12/29/2010 ExitCare Patient Information 2012 ExitCare[employer]. 

## 2011-09-29 LAB — GC/CHLAMYDIA PROBE AMP, GENITAL
Chlamydia, DNA Probe: NEGATIVE
GC Probe Amp, Genital: NEGATIVE

## 2011-09-29 LAB — WET PREP, GENITAL: Clue Cells Wet Prep HPF POC: NONE SEEN

## 2011-10-19 ENCOUNTER — Ambulatory Visit (INDEPENDENT_AMBULATORY_CARE_PROVIDER_SITE_OTHER): Payer: Self-pay | Admitting: Obstetrics and Gynecology

## 2011-10-19 ENCOUNTER — Encounter: Payer: Self-pay | Admitting: Obstetrics and Gynecology

## 2011-10-19 VITALS — BP 99/60 | Temp 97.7°F | Wt 163.8 lb

## 2011-10-19 DIAGNOSIS — Z23 Encounter for immunization: Secondary | ICD-10-CM

## 2011-10-19 DIAGNOSIS — O24419 Gestational diabetes mellitus in pregnancy, unspecified control: Secondary | ICD-10-CM

## 2011-10-19 DIAGNOSIS — O99810 Abnormal glucose complicating pregnancy: Secondary | ICD-10-CM

## 2011-10-19 LAB — POCT URINALYSIS DIP (DEVICE)
Hgb urine dipstick: NEGATIVE
Ketones, ur: NEGATIVE mg/dL
Protein, ur: NEGATIVE mg/dL
Specific Gravity, Urine: 1.025 (ref 1.005–1.030)
pH: 6 (ref 5.0–8.0)

## 2011-10-19 MED ORDER — INFLUENZA VIRUS VACC SPLIT PF IM SUSP
0.5000 mL | Freq: Once | INTRAMUSCULAR | Status: AC
  Administered 2011-10-19: 0.5 mL via INTRAMUSCULAR

## 2011-10-19 NOTE — Patient Instructions (Signed)
Diabetes mellitus gestacional (Gestational Diabetes Mellitus) La diabetes mellitus gestacional se produce slo durante el embarazo. Aparece cuando el organismo no puede controlar adecuadamente la glucosa (azcar) que aumenta en la sangre despus de comer. Durante el embarazo, se produce una resistencia a la insulina (sensibilidad reducida a la insulina) debido a la liberacin de hormonas por parte de la placenta. Generalmente, el pncreas de una mujer embarazada produce la cantidad suficiente de insulina para vencer esa resistencia. Sin embargo, en la diabetes gestacional, hay insulina pero no cumple su funcin adecuadamente. Si la resistencia es lo suficientemente grave como para que el pncreas no produzca la cantidad de insulina suficiente, la glucosa extra se acumula en la sangre.  QUINES TIENEN RIESGO DE DESARROLLAR DIABETES GESTACIONAL?  Las mujeres con historia de diabetes en la familia.   Las mujeres de ms de 25 aos.   Las que presentan sobrepeso.   Las mujeres que pertenecen a ciertos grupos tnicos (latinas, afroamericanas, norteamericanas nativas, asiticas y las originarias de las islas del Pacfico.  QUE PUEDE OCURRIRLE AL BEB? Si el nivel de glucosa en sangre de la madre es demasiado elevado mientras este embarazada, el nivel extra de azcar pasar por el cordn umbilical hacia el beb. Algunos de los problemas del beb pueden ser:  Beb demasiado grande: si el nio recibe demasiada azcar, puede aumentar mucho de peso. Esto puede hacer que sea demasiado grande para nacer por parto normal (vaginal) por lo que ser necesario realizar una cesrea.   Bajo nivel de glucosa (hipoglucemia): el beb produce insulina extra en respuesta a la excesiva cantidad de azcar que obtiene de la madre. Cuando el beb nace y ya no necesita insulina extra, su nivel de azcar en sangre puede disminuir.   Ictericia (coloracin amarillenta de la piel y los ojos): esto es bastante frecuente en los  bebs. La causa es la acumulacin de una sustancia qumica denominada bilirrubina. No siempre es un trastorno grave, pero se observa con frecuencia en los bebs cuyas madres sufren diabetes gestacional.  RIESGOS PARA LA MADRE Las mujeres que han sufrido diabetes gestacional pueden tener ms riesgos para algunos problemas como:  Preeclampsia o toxemia, incluyendo problemas con hipertensin arterial. La presin arterial y los niveles de protenas en la orina deben controlarse con frecuencia.   Infecciones   Parto por cesrea.   Aparicin de diabetes tipo 2 en una etapa posterior de la vida. Alrededor del 30% al 50% sufrir diabetes posteriormente, especialmente las que son obesas.  DIAGNSTICO Las hormonas que causan resistencia a la insulina tienen su mayor nivel alrededor de las 24 a 28 semanas del embarazo. Si se experimentan sntomas, stos son similares a los sntomas que normalmente aparecen durante el embarazo.  La diabetes mellitus gestacional generalmente se diagnostica por medio de un mtodo en dos partes: 1. Despus de la 24 a 28 semanas de embarazo, la mujer debe beber una solucin que contiene glucosa y realizar un anlisis de sangre. Si el nivel de glucosa es elevado, la realizarn un segundo anlisis.  2. La prueba oral de tolerancia a la glucosa, que dura aproximadamente tres horas. Despus de realizar ayuno durante la noche, se controla nivel de glucosa en sangre. La mujer bebe una solucin que contiene glucosa y le realizan anlisis de glucosa en sangre cada hora.  Si la mujer tiene factores de riesgos para la diabetes mellitus gestacional, el mdico podr indicar el anlisis antes de las 24 semanas de embarazo. TRATAMIENTO El tratamiento est dirigido a mantener la glucosa en   sangre de Chief Strategy Officer en un nivel normal y puede incluir:  La planificacin de los alimentos.   Recibir insulina u otro medicamento para Sales executive nivel de glucosa en [place].   La prctica de ejercicios.    Llevar un registro diario de los alimentos que consume.   Control y Engineer, maintenance (IT) de los niveles de glucosa en [place].   Control de los niveles de cetona en la [place], [place] esto ya no se considera necesario en la mayora de los [place].  INSTRUCCIONES PARA EL CUIDADO DOMICILIARIO Mientras est embarazada:  Siga los consejos de su mdico relacionados con los controles prenatales, la planificacin de la comida, la actividad fsica, los [place], vitaminas, los anlisis de sangre y otras pruebas y las actividades fsicas.   Lleve un registro de las comidas, las pruebas de glucosa en sangre y la cantidad de insulina que recibe (si corresponde). Muestre todo al profesional en cada consulta mdica prenatal.   Si sufre diabetes mellitus gestacional, podr tener problemas de hipoglucemia (nivel bajo de glucosa en sangre). Podr sospechar este problema si se siente repentinamente mareada, tiene temblores y/o se siente dbil. Si cree que esto le est ocurriendo, y tiene un medidor de glucosa, mida su nivel de Event organiser. Siga los consejos de su mdico sobre el modo y el momento de tratar su nivel de glucosa en sangre. Generalmente se sigue la regla 15:15 Consuma 15 g de hidratos de carbono, espere 15 minutos y Programmer, systems el nivel de glucosa en [place].Barbara Cower de 15 g de hidratos de carbono son:   1 taza de [employer].    taza de jugo.   3-4 tabletas de glucosa.   5-6 caramelos duros.   1 caja pequea de pasas de uva.    taza de gaseosa comn.   Mantenga una buena higiene para evitar infecciones.   No fume.  SOLICITE ATENCIN MDICA SI:  Observa prdida vaginal con o sin picazn.   Se siente ms dbil o cansada que lo habitual.   Primus [place]ravo.   Tiene un aumento de peso repentino, 2,5 kg o ms en una semana.   Pierde peso, 1.5 kg o ms en una semana.   Su nivel de glucosa en sangre es elevado, necesita instrucciones.  SOLICITE ATENCIN MDICA DE  INMEDIATO SI:  Sufre una cefalea intensa.   Se marea o pierde el conocimiento   Presenta nuseas o vmitos.   Se siente desorientada confundida.   Sufre convulsiones.   Tiene problemas de visin.   Siente Physiological scientist.   Presenta una hemorragia vaginal abundante.   Tiene contracciones uterinas.   Tiene una prdida importante de lquido por la vagina  DESPUS QUE NACE EL BEB:  Concurra a todos los controles de seguimiento y Clinical biochemist los anlisis de sangre segn las indicaciones de su mdico.   Mantenga un estilo de vida saludable para evitar la diabetes en el futuro. Aqu se incluye:   Siga el plan de alimentacin saludable.   Controle su peso.   Practique actividad fsica y descanse lo necesario.   No fume.   Amamante a su beb mientras pueda. Esto disminuir la probabilidad de que usted y su beb sufran diabetes posteriormente.  Para ms informacin acerca de la diabetes, visite la pgina web de Holiday representative Diabetes Association: PMFashions.com.cy. Para ms informacin acerca de la diabetes gestacional cite la pgina web del [employer] Obst[employer]ians and Gynecologists en: RentRule.com.au. Document Released: 10/19/2004 Document Revised: 12/29/2010 ExitCare Patient Information 2012  ExitCare[employer].

## 2011-10-19 NOTE — Progress Notes (Signed)
U/S scheduled Sept. 30, 2013 at 115pm.

## 2011-10-19 NOTE — Progress Notes (Signed)
Pulse 73 Reviewed CBGs and diet. F:74-94, PPs all <120. Flu shot. Schedule [place] growth d/t marginal cord insertion.

## 2011-10-23 ENCOUNTER — Ambulatory Visit (HOSPITAL_COMMUNITY)
Admission: RE | Admit: 2011-10-23 | Discharge: 2011-10-23 | Disposition: A | Discharge status: Home / Self Care | Payer: Self-pay | Source: Ambulatory Visit | Attending: Obstetrics and Gynecology | Admitting: Obstetrics and Gynecology

## 2011-10-23 DIAGNOSIS — O24419 Gestational diabetes mellitus in pregnancy, unspecified control: Secondary | ICD-10-CM

## 2011-10-23 DIAGNOSIS — Z3689 Encounter for other specified antenatal screening: Secondary | ICD-10-CM | POA: Insufficient documentation

## 2011-10-23 DIAGNOSIS — O99810 Abnormal glucose complicating pregnancy: Secondary | ICD-10-CM | POA: Insufficient documentation

## 2011-10-23 DIAGNOSIS — Z23 Encounter for immunization: Secondary | ICD-10-CM

## 2011-10-26 ENCOUNTER — Ambulatory Visit (INDEPENDENT_AMBULATORY_CARE_PROVIDER_SITE_OTHER): Payer: Self-pay | Admitting: Obstetrics & Gynecology

## 2011-10-26 VITALS — BP 100/55 | Temp 97.5°F | Wt 163.6 lb

## 2011-10-26 DIAGNOSIS — O24419 Gestational diabetes mellitus in pregnancy, unspecified control: Secondary | ICD-10-CM

## 2011-10-26 DIAGNOSIS — O0990 Supervision of high risk pregnancy, unspecified, unspecified trimester: Secondary | ICD-10-CM

## 2011-10-26 DIAGNOSIS — N898 Other specified noninflammatory disorders of vagina: Secondary | ICD-10-CM

## 2011-10-26 DIAGNOSIS — O99810 Abnormal glucose complicating pregnancy: Secondary | ICD-10-CM

## 2011-10-26 LAB — POCT URINALYSIS DIP (DEVICE)
Bilirubin Urine: NEGATIVE
Glucose, UA: NEGATIVE mg/dL
Hgb urine dipstick: NEGATIVE
Nitrite: NEGATIVE
Specific Gravity, Urine: 1.02 (ref 1.005–1.030)

## 2011-10-26 NOTE — Progress Notes (Signed)
P = 79 Occasional pain in the lower abdomen

## 2011-10-26 NOTE — Progress Notes (Signed)
Fastings 85,86,90,116,90,82,86  2 hr pp breakfast 89,95,88,84,116,98,136;   2 hr pp lunch 104,90165,102,121,79;   2 hr  121,94,89,140,107.  Growth 62%.  Marginal cord insertion.  Pt doesn't think she ever got genetic testing.  Too late now.  Pt offered [place], but given cost, pt would not like to proceed with that test.  Pt would not have terminated with an abmnl result.  Pt complaining of discharge.  Pooling and fern negative.  GC/Chlam and wet prep sent.

## 2011-10-27 LAB — WET PREP, GENITAL

## 2011-10-30 ENCOUNTER — Ambulatory Visit: Payer: Self-pay | Admitting: Obstetrics & Gynecology

## 2011-10-30 ENCOUNTER — Other Ambulatory Visit: Payer: Self-pay | Admitting: Obstetrics & Gynecology

## 2011-10-30 ENCOUNTER — Encounter: Payer: Self-pay | Attending: Obstetrics & Gynecology | Admitting: Dietician

## 2011-10-30 DIAGNOSIS — Z713 Dietary counseling and surveillance: Secondary | ICD-10-CM | POA: Insufficient documentation

## 2011-10-30 DIAGNOSIS — O99810 Abnormal glucose complicating pregnancy: Secondary | ICD-10-CM | POA: Insufficient documentation

## 2011-10-30 MED ORDER — METRONIDAZOLE 500 MG PO TABS: 500.0000 mg | ORAL_TABLET | Freq: Two times a day (BID) | ORAL | Status: DC

## 2011-10-30 NOTE — Progress Notes (Signed)
Pt has few clue cells.  Will treat with Flagyl

## 2011-10-30 NOTE — Progress Notes (Signed)
Diabetes Education:  Seen today for glucose/monitoring issues.  Weight today is 165.6 lb.  Fasting glucose: 80-74-93 mg, PC Breakfast (notes she eats the same breakfast every day, 2 slices of bread and a glass of milk) [phone]  PC lunch: 77-79-114-121-165.  PC dinner:94-89-140-122-105.  She is very closely following her blood glucose levels.  Will do 1-2 lancings to determine her blood glucose level.  She notes a problem with the post-meal at breakfast as often being the same as the pre-meal reading.  She has gained 2 lb since 10/3 on the scales here today.  Will try to follow-up at next Monday clinic appointment.  Maggie Dhwani Venkatesh, RN, CDE

## 2011-10-31 NOTE — Progress Notes (Addendum)
Called pt w/[employer] interpreter # [phone] and left message that I was calling with test results. Please call the clinic. 10/11 1050- second call to pt w/[employer] interpreter # [phone]. Message left that her results from last week have been reviewed by the doctor and a prescription has been sent to her pharmacy. Please obtain the medication and take according to directions. If she would like additional information of her results she may call the clinic.

## 2011-11-09 ENCOUNTER — Ambulatory Visit (INDEPENDENT_AMBULATORY_CARE_PROVIDER_SITE_OTHER): Payer: Self-pay | Admitting: Advanced Practice Midwife

## 2011-11-09 ENCOUNTER — Encounter: Payer: Self-pay | Admitting: Advanced Practice Midwife

## 2011-11-09 DIAGNOSIS — O99810 Abnormal glucose complicating pregnancy: Secondary | ICD-10-CM

## 2011-11-09 NOTE — Progress Notes (Signed)
Pulse: 81

## 2011-11-09 NOTE — Progress Notes (Signed)
Doing well.  Good fetal movement, denies vaginal bleeding, LOF, regular contractions.  Fasting BS: 85-97, PP: 81-158, mostly under 130s.  Was out of lancets but picked up more today. Pt has not picked up Flagyl prescribed for BV but will pick this up today also.

## 2011-11-17 ENCOUNTER — Encounter: Payer: Self-pay | Admitting: *Deleted

## 2011-11-21 ENCOUNTER — Telehealth: Payer: Self-pay | Admitting: *Deleted

## 2011-11-21 MED ORDER — METRONIDAZOLE 500 MG PO TABS: 500.0000 mg | ORAL_TABLET | Freq: Two times a day (BID) | ORAL | Status: DC

## 2011-11-21 NOTE — Telephone Encounter (Signed)
Pt came to clinic window with question about a previous Rx for metronidazole. She states that she went to the pharmacy to pick it up last week and they did not have the information. I told pt that I will call the pharmacy and she may pick it up later today.  Pt voiced understanding.  I then called and spoke with pharmacy employee who stated that they had no information or record of the Rx sent on 10/7.  I stated that I would re-send the Rx and that pt would pick it up later today.

## 2011-11-23 ENCOUNTER — Ambulatory Visit (INDEPENDENT_AMBULATORY_CARE_PROVIDER_SITE_OTHER): Payer: Self-pay | Admitting: Advanced Practice Midwife

## 2011-11-23 ENCOUNTER — Encounter: Payer: Self-pay | Admitting: Advanced Practice Midwife

## 2011-11-23 VITALS — BP 103/54 | Temp 98.3°F | Wt 165.2 lb

## 2011-11-23 DIAGNOSIS — O99810 Abnormal glucose complicating pregnancy: Secondary | ICD-10-CM

## 2011-11-23 DIAGNOSIS — K219 Gastro-esophageal reflux disease without esophagitis: Secondary | ICD-10-CM

## 2011-11-23 LAB — POCT URINALYSIS DIP (DEVICE)
Bilirubin Urine: NEGATIVE
Ketones, ur: NEGATIVE mg/dL
pH: 6 (ref 5.0–8.0)

## 2011-11-23 MED ORDER — RANITIDINE HCL 150 MG PO TABS: 150.0000 mg | ORAL_TABLET | Freq: Two times a day (BID) | ORAL | Status: DC

## 2011-11-23 NOTE — Progress Notes (Signed)
Doing well.  Good fetal movement, denies vaginal bleeding, LOF, regular contractions.  Feeling some heartburn daily after eating and has labial swelling which is uncomfortable.  She is also fatigued but is not sleeping well at night because of stress.  Discussed Zantac prescription and compression shorts/stockings for labial swelling. Blood glucose (F: 82-107, with higher values last week, 80s-90s this week; B: 86-124, L: 82-149, D: 98-149 with one outlier 161).

## 2011-11-23 NOTE — Progress Notes (Signed)
P = 84 "swelling" in the vaginal area

## 2011-11-23 NOTE — Addendum Note (Signed)
Addended by: Sharen Counter A on: 11/23/2011 11:03 AM   Modules accepted: Orders

## 2011-12-07 ENCOUNTER — Ambulatory Visit (INDEPENDENT_AMBULATORY_CARE_PROVIDER_SITE_OTHER): Payer: Self-pay | Admitting: Family Medicine

## 2011-12-07 VITALS — BP 95/55 | Temp 97.5°F | Wt 164.8 lb

## 2011-12-07 DIAGNOSIS — O99810 Abnormal glucose complicating pregnancy: Secondary | ICD-10-CM

## 2011-12-07 DIAGNOSIS — O0990 Supervision of high risk pregnancy, unspecified, unspecified trimester: Secondary | ICD-10-CM

## 2011-12-07 DIAGNOSIS — IMO0002 Reserved for concepts with insufficient information to code with codable children: Secondary | ICD-10-CM

## 2011-12-07 LAB — POCT URINALYSIS DIP (DEVICE)
Bilirubin Urine: NEGATIVE
Glucose, UA: NEGATIVE mg/dL
Ketones, ur: NEGATIVE mg/dL
Protein, ur: NEGATIVE mg/dL
Specific Gravity, Urine: 1.02 (ref 1.005–1.030)

## 2011-12-07 MED ORDER — GLYBURIDE 2.5 MG PO TABS: 2.5000 mg | ORAL_TABLET | Freq: Every day | ORAL | Status: DC

## 2011-12-07 NOTE — Progress Notes (Signed)
U/S scheduled 12/11/11 at 345pm.

## 2011-12-07 NOTE — Progress Notes (Signed)
Pulse- 82  Edema-hands  Pressure- lower abd Pt c/o SOB "feels like hyperventilated".  Was given flagyl for discharge but pt c/o still having discharge

## 2011-12-07 NOTE — Patient Instructions (Addendum)
Toma 1 tableta (2.5 mg) Glyburide despues de desayuna. Deje de tomarlo si tiene sintomas de temblor/sudor/confusion.   Diabetes mellitus gestacional (Gestational Diabetes Mellitus) La diabetes mellitus gestacional se produce slo durante el embarazo. Aparece cuando el organismo no puede controlar adecuadamente la glucosa (azcar) que aumenta en la sangre despus de comer. Durante el [place], se produce una resistencia a la insulina (sensibilidad reducida a la insulina) debido a la liberacin de hormonas por parte de la placenta. Generalmente, el pncreas de una mujer embarazada produce la cantidad suficiente de insulina para vencer esa resistencia. Sin embargo, en la diabetes gestacional, hay insulina pero no cumple su funcin adecuadamente. Si la resistencia es lo suficientemente grave como para que el pncreas no produzca la cantidad de insulina suficiente, la glucosa extra se acumula en la sangre.  Devota Pace RIESGO DE DESARROLLAR DIABETES GESTACIONAL?  Las mujeres con historia de diabetes en la familia.  Las mujeres de ms de [address]  Las que presentan sobrepeso.  Las [employer] pertenecen a ciertos grupos tnicos (latinas, afroamericanas, norteamericanas nativas, asiticas y las originarias de las islas del Pacfico. QUE PUEDE OCURRIRLE AL BEB? Si el nivel de glucosa en sangre de la madre es demasiado elevado mientras este [place], el nivel extra de azcar pasar por el cordn umbilical hacia el beb. Algunos de los problemas del beb pueden ser:  Beb demasiado grande: si el nio recibe Chief Strategy Officer, puede aumentar mucho de [place]. Esto puede hacer que sea demasiado grande para nacer por parto normal (vaginal) por lo que ser necesario realizar una cesrea.  Bajo nivel de glucosa (hipoglucemia): el beb produce insulina extra en respuesta a la excesiva cantidad de azcar que obtiene de [employer]. Cuando el beb nace y ya no necesita insulina extra, su nivel de azcar en sangre  puede disminuir.  Ictericia (coloracin amarillenta de la piel y los ojos): esto es bastante frecuente en los bebs. La causa es la acumulacin de una sustancia qumica denominada bilirrubina. No siempre es un trastorno grave, pero se observa con frecuencia en los bebs cuyas madres sufren diabetes gestacional. RIESGOS PARA LA MADRE Las mujeres que han sufrido diabetes gestacional pueden tener ms riesgos para algunos problemas como:  Preeclampsia o toxemia, incluyendo problemas con hipertensin arterial. La presin arterial y los niveles de protenas en la orina deben controlarse con frecuencia.  Infecciones  Parto por cesrea.  Aparicin de diabetes tipo 2 en una etapa posterior de la vida. Alrededor del 30% al 50% sufrir diabetes posteriormente, especialmente las que son obesas. DIAGNSTICO Las hormonas que causan resistencia a la insulina tienen su mayor nivel alrededor de las 24 a 28 semanas del Psychiatrist. Si se experimentan sntomas, stos son similares a los sntomas que normalmente aparecen durante el embarazo.  La diabetes mellitus gestacional generalmente se diagnostica por medio de un mtodo en dos partes: 1. Despus de la 24 a 28 semanas de Psychiatrist, la mujer debe beber una solucin que contiene glucosa y Education officer, environmental un anlisis de [place]. Si el nivel de glucosa es elevado, la realizarn un segundo [place]. 2. La prueba oral de tolerancia a la glucosa, que dura aproximadamente tres horas. Despus de realizar ayuno durante la noche, se controla nivel de glucosa en sangre. La mujer bebe una solucin que contiene glucosa y Chief Executive Officer realizan anlisis de glucosa en sangre cada hora. Si la mujer tiene factores de riesgos para la diabetes mellitus gestacional, el mdico podr Programme researcher, broadcasting/film/video anlisis antes de las 24 semanas de [place]. TRATAMIENTO El tratamiento est  dirigido a Horticulturist, commercial de la madre en un nivel normal y puede incluir:  La planificacin de los alimentos.  Recibir  insulina u otro medicamento para Sales executive nivel de glucosa en [place].  La prctica de ejercicios.  Llevar un registro diario de los alimentos que consume.  Control y Engineer, maintenance (IT) de los niveles de glucosa en [place].  Control de los niveles de cetona en la [place], [place] esto ya no se considera necesario en la mayora de los [place]. INSTRUCCIONES PARA EL CUIDADO DOMICILIARIO Mientras est embarazada:  Siga los consejos de su mdico relacionados con los controles prenatales, la planificacin de la comida, la actividad fsica, los [place], vitaminas, los anlisis de sangre y otras pruebas y las actividades fsicas.  Lleve un registro de las comidas, las pruebas de glucosa en sangre y la cantidad de insulina que recibe (si corresponde). Muestre todo al profesional en cada consulta mdica prenatal.  Si sufre diabetes mellitus gestacional, podr tener problemas de hipoglucemia (nivel bajo de glucosa en sangre). Podr sospechar este problema si se siente repentinamente mareada, tiene temblores y/o se siente dbil. Si cree que esto le est ocurriendo, y tiene un medidor de glucosa, mida su nivel de Event organiser. Siga los consejos de su mdico sobre el modo y el momento de tratar su nivel de glucosa en sangre. Generalmente se sigue la regla 15:15 Consuma 15 g de hidratos de carbono, espere 15 minutos y Programmer, systems el nivel de glucosa en [place].Barbara Cower de 15 g de hidratos de carbono son:  1 taza de [employer].   taza de jugo.  3-4 tabletas de glucosa.  5-6 caramelos duros.  1 caja pequea de pasas de uva.   taza de gaseosa comn.  Mantenga una buena higiene para evitar infecciones.  No fume. SOLICITE ATENCIN MDICA SI:  Observa prdida vaginal con o sin picazn.  Se siente ms dbil o cansada que lo habitual.  Primus [place].  Tiene un aumento de peso repentino, 2,5 kg o ms en una semana.  Pierde peso, 1.5 kg o ms en una semana.  Su nivel de glucosa  en sangre es elevado, necesita instrucciones. SOLICITE ATENCIN MDICA DE INMEDIATO SI:  Sufre una cefalea intensa.  Se marea o pierde el conocimiento  Presenta nuseas o vmitos.  Se siente desorientada confundida.  Sufre convulsiones.  Tiene problemas de visin.  Siente Physiological scientist.  Presenta una hemorragia vaginal abundante.  Tiene contracciones uterinas.  Tiene una prdida importante de lquido por la vagina DESPUS QUE NACE EL BEB:  Concurra a todos los controles de seguimiento y Clinical biochemist los anlisis de sangre segn las indicaciones de su mdico.  Mantenga un estilo de vida saludable para evitar la diabetes en el futuro. Aqu se incluye:  Siga el plan de alimentacin saludable.  Controle su peso.  Practique actividad fsica y descanse lo necesario.  No fume.  Amamante a su beb mientras pueda. Esto disminuir la probabilidad de que usted y su beb sufran diabetes posteriormente. Para ms informacin acerca de la diabetes, visite la pgina web de Holiday representative Diabetes Association: [email or web address].com.cy. Para ms informacin acerca de la diabetes gestacional cite la pgina web del [employer] of Obstetricians and Gynecologists en: RentRule.com.au. Document Released: 10/19/2004 Document Revised: 04/03/2011 [facility] Patient Information 2013 [place], [place].  Embarazo  Systems analyst trimestre  (Pregnancy - Third Trimester) El tercer trimestre del Psychiatrist (los ltimos 3 meses) es el perodo en el cual tanto usted como su beb crecen con  ms rapidez. El beb alcanza un largo de aproximadamente 50 cm. y pesa entre 2,700 y 4,500 kg. El beb gana ms tejido graso y est listo para la vida fuera del cuerpo de la [place]. Mientras estn en el interior, los bebs tienen perodos de sueo y vigilia, Warehouse manager y tienen hipo. Quizs sienta pequeas contracciones del tero. Este es el falso trabajo de [place]. Tambin se las conoce como contracciones de  [place] . Es como una prctica del parto. Los problemas ms habituales de esta etapa del embarazo incluyen mayor dificultad para respirar, hinchazn de las manos y los pies por retencin de lquidos y la necesidad de Geographical information systems officer con ms frecuencia debido a que el tero y el beb presionan sobre la vejiga.  EXAMENES PRENATALES   Durante los [employer], deber seguir realizndose anlisis de [place]. Estas pruebas se realizan para controlar su salud y la del beb. Los [employer] Corporation de sangre se Radiographer, therapeutic para [employer] niveles de algunos compuestos de la sangre (hemoglobina). La anemia (bajo nivel de hemoglobina) es frecuente durante el embarazo. Para prevenirla, se administran hierro y vitaminas. Tambin le tomarn nuevas anlisis para descartar diabetes. Podrn repetirle algunas de las Hov[employer] Enterprises hicieron previamente.  En cada visita le medirn el tamao del tero. Esto permite asegurar que el beb se desarrolla adecuadamente, segn la fecha del embarazo.  Le controlarn la presin arterial en cada visita prenatal. Esto es para asegurarse de que no sufre toxemia.  Le harn un anlisis de orina en cada visita prenatal, para descartar infecciones, diabetes y la presencia de protenas.  Tambin en cada visita controlarn su peso. Esto se realiza para asegurarse que aumenta de peso al ritmo indicado y que usted y su beb evolucionan normalmente.  En algunas ocasiones se realiza una prueba de ultrasonido para confirmar el correcto desarrollo y evolucin del beb. Esta prueba se realiza con ondas sonoras inofensivas para el beb, de modo que el profesional pueda calcular ms precisamente la fecha del [place].  Analice con su mdico los analgsicos y la anestesia que recibir durante el [place] de parto y [place].  Comente la posibilidad de que necesite una cesrea y qu anestesia se recibir.  Informe a su mdico si sufre violencia familiar mental o fsica. A veces, se indica la prueba  especializada sin estrs, la prueba de tolerancia a las contracciones y el perfil biofsico para asegurarse de que el beb no tiene problemas. El estudio del lquido amnitico que rodea al beb se llama amniocentesis. El lquido amnitico se obtiene introduciendo una aguja en el vientre (abdomen ). En ocasiones se lleva a cabo cerca del final del embarazo, si es necesario inducir a un parto. En este caso se realiza para asegurarse que los pulmones del beb estn lo suficientemente maduros como para que pueda vivir fuera del tero. Si los pulmones no han madurado y es peligroso que el beb nazca, se Building services engineer a la madre una inyeccin de [place] , 1 a 2 [address]  [address] antes del [address]. Vivia Budge ayuda a que los pulmones del beb maduren y sea ms seguro su nacimiento.  CAMBIOS QUE OCURREN EN EL TERCER TRIMESTRE DEL EMBARAZO  Su organismo atravesar numerosos cambios durante el [place]. Estos pueden variar de Neomia Dear persona a otra. Converse con el profesional que la asiste acerca los cambios que usted note y que la preocupen.   Durante el ltimo trimestre probablemente sienta un aumento del apetito. Es normal tener "antojos" de Development worker, community. Esto vara de Neomia Dear persona  a [place] y de un embarazo a Therapist, art.  Podrn aparecer las primeras estras en las caderas, abdomen y [place]. Estos son cambios normales del cuerpo durante el [place]. No existen medicamentos ni ejercicios que puedan prevenir [employer].  La constipacin puede tratarse con un laxante o agregando fibra a su dieta. Beber grandes cantidades de lquidos, tomar fibras en forma de vegetales, frutas y granos integrales es de gran [place].  Tambin es beneficioso practicar actividad fsica. Si ha sido una persona Engineer, mining, podr continuar con la Harley[employer]son de las actividades durante el mismo. Si ha sido [employer], puede ser beneficioso que comience con un programa de ejercicios, Museum/gallery exhibitions officer. Consulte con el profesional que la asiste antes  de comenzar un programa de ejercicios.  Evite el consumo de cigarrillos, el alcohol, los medicamentos no recetados y las "drogas de la calle" durante el Psychiatrist. Estas sustancias qumicas afectan la formacin y el desarrollo del beb. Evite estas sustancias durante todo el embarazo para asegurar el nacimiento de un beb sano.  Podr sentir dolor de espalda, tener vrices en las venas y hemorroides, o si ya los sufra, pueden [place].  Durante el tercer trimestre se cansar con ms facilidad, lo cual es normal.  Los movimientos del beb pueden ser ms fuertes y con ms frecuencia.  Puede que note dificultades para respirar normalmente.  El ombligo puede salir hacia afuera.  A veces sale Veterinary surgeon de las [place], que se llama Product manager.  Podr aparecer Neomia Dear secrecin mucosa con sangre. Esto suele ocurrir General Electric unos [address] y Neomia Dear semana antes del [place]. INSTRUCCIONES PARA EL CUIDADO EN EL HOGAR   Cumpla con las citas de control. Siga las indicaciones del mdico con respecto al uso de [place], los ejercicios y la dieta.  Durante el embarazo debe obtener nutrientes para usted y para su beb. Consuma alimentos balanceados a intervalos regulares. Elija alimentos como carne, pescado, [place] y otros productos lcteos descremados, vegetales, frutas, panes integrales y cereales. El [employer] informar cul es el aumento de peso ideal.  Las relaciones sexuales pueden continuarse hasta casi el final del embarazo, si no se presentan otros problemas como prdida prematura (antes de [place]) de lquido amnitico, hemorragia vaginal o dolor en el vientre (abdominal).  Realice Tesoro Corporation, si no tiene restricciones. Consulte con el profesional que la asiste si no sabe con certeza si determinados ejercicios son seguros. El mayor aumento de peso se producir en los ltimos 2 trimestres del Psychiatrist. El ejercicio ayuda a:  Engineering geologist.  Mantenerse en forma para el  trabajo de parto y [place] .  Perder peso despus del parto.  Haga reposo con frecuencia, con las piernas elevadas, o segn lo necesite para evitar los calambres y el dolor de cintura.  Use un buen sostn o como los que se usan para hacer deportes para Paramedic la sensibilidad de las [place]. Tambin puede serle til si lo [place] mientras duerme. Si pierde Product manager, podr [employer]in.  No utilice la baera con agua caliente, baos turcos y saunas.  Colquese el cinturn de seguridad cuando conduzca. Este la proteger a usted y al beb en caso de accidente.  Evite comer carne cruda y el contacto con los utensilios y desperdicios de los gatos. Estos elementos contienen grmenes que pueden causar defectos de nacimiento en el beb.  Es fcil perder algo de orina durante el [place]. Apretar y Chief Operating Officer los msculos de la pelvis la  ayudar con este problema. Practique detener la miccin cuando est en el bao. Estos son los mismos msculos que Development worker, international aid. Son [employer] Partners mismos msculos que utiliza cuando trata de evitar despedir gases. Puede practicar apretando estos msculos WellPoint, y repetir esto tres veces por da aproximadamente. Una vez que conozca qu msculos debe apretar, no realice estos ejercicios durante la miccin. Puede favorecerle una infeccin si la orina vuelve hacia atrs.  Pida ayuda si tienen necesidades financieras, teraputicas o nutricionales. El profesional podr ayudarla con respecto a estas necesidades, o derivarla a otros especialistas.  Haga una lista de nmeros telefnicos de emergencia y tngalos disponibles.  Planifique como obtener ayuda de familiares o amigos cuando regrese a Programmer, applications hospital.  Hacer un ensayo sobre la partida al hospital.  [place] clases prenatales con el padre para entender, practicar y hacer preguntas sobre el [place] de parto y el alumbramiento.  Preparar la habitacin del beb / busque Fatima Blank.  No  viaje fuera de la ciudad a menos que sea absolutamente necesario y con el asesoramiento de su mdico.  Use slo zapatos de tacn bajo o sin tacn para tener mejor equilibrio y Automotive engineer cadas. USO DE MEDICAMENTOS Y CONSUMO DE DROGAS DURANTE EL [facility]   Tome las vitaminas apropiadas para esta etapa tal como se le indic. Las vitaminas deben contener un miligramo de cido flico. Guarde todas las vitaminas fuera del alcance de los nios. La ingestin de slo un par de vitaminas o tabletas que contengan hierro pueden ocasionar la [employer] Mining en un beb o en un nio pequeo.  Evite el uso de [employer] Mutual [employer] Omaha, incluyendo hierbas, medicamentos de [place], sin receta o que no hayan sido sugeridos por su mdico. Slo tome medicamentos de venta libre o medicamentos recetados para Chief Technology Officer, Environmental health practitioner o fiebre como lo indique su mdico. No tome aspirina, ibuprofeno (Motrin, Advil, Nuprin) o naproxeno (Aleve) excepto que su mdico se lo indique.  Infrmele al profesional si consume alguna droga.  El alcohol se relaciona con ciertos defectos congnitos. Incluye el sndrome de alcoholismo fetal. Debe evitar absolutamente el consumo de alcohol, en cualquier forma. El fumar produce baja tasa de natalidad y bebs prematuros.  Las drogas ilegales o de la calle son muy perjudiciales para el beb. Estn absolutamente prohibidas. Un beb que nace de [employer] Express, ser adicto al nacer. Ese beb tendr los mismos sntomas de abstinencia que un adulto. SOLICITE ATENCIN MDICA SI:  Tiene preguntas o preocupaciones relacionadas con el embarazo. Es mejor que llame para formular las preguntas si no puede esperar hasta la prxima visita, que sentirse preocupada por ellas.  DECISIONES ACERCA DE LA CIRCUNCISIN  Usted puede saber o no cul es el sexo de su beb. Si ya sabe que ser un varn, este es el momento de pensar acerca de la circuncisin. La circuncisin es la extirpacin del prepucio. Esta es la piel que  cubre el extremo sensible del pene. No hay un motivo mdico que lo justifique. Generalmente la decisin se toma segn lo que sea popular en ese momento, o segn creencias religiosas. Podr conversar estos temas con su mdico o con el pediatra.  SOLICITE ATENCIN MDICA DE INMEDIATO SI:   La temperatura oral le sube a ms de 102 F (38.9 C) o lo que su mdico le indique.  Tiene una prdida de lquido por la vagina (canal de parto). Si sospecha una ruptura de las [place], tmese la temperatura y llame al profesional para informarlo sobre  esto.  Observa unas pequeas manchas, una hemorragia vaginal o elimina cogulos. Notifique al profesional acerca de la cantidad y de cuntos apsitos est utilizando.  Presenta un olor desagradable en la secrecin vaginal y observa un cambio en el color, de transparente a blanco.  Ha vomitado durante ms de 24 horas.  Siente escalofros o le sube la fiebre.  Le falta el aire.  Siente ardor al Beatrix Shipper.  Baja o sube ms de 2 libras (900 g), o segn lo indicado por el profesional que la asiste.  Observa que sbitamente se le hinchan el rostro, las manos, los pies o las piernas.  Siente dolor en el vientre (abdominal). Las [employer]ogul en el ligamento redondo son Neomia Dear causa benigna frecuente de dolor abdominal durante el embarazo. El profesional que la asiste deber evaluarla.  Presenta dolor de cabeza intenso que no se [place].  Tiene problemas visuales, visin doble o borrosa.  Si no siente los movimientos del beb durante ms de 1 hora. Si piensa que el beb no se mueve tanto como lo haca habitualmente, coma algo que Psychologist, clinical y Target Corporation lado izquierdo durante [place]. El beb debe moverse al menos 4  5 veces por hora. Comunquese inmediatamente si el beb se mueve menos que lo indicado.  Se cae, se ve involucrada en un accidente automovilstico o sufre algn tipo de traumatismo.  En su hogar hay violencia mental o fsica. Document  Released: 10/19/2004 Document Revised: 07/11/2011 [facility] Patient Information 2013 [place], [place].

## 2011-12-07 NOTE — Progress Notes (Signed)
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 Suggested using cotton under-goves. Dyspnea, chest tightness & headache with exertion, relieved by leaning forward. Likely physiological, no cough, pain or radiation. Did not get 28-week labs done. Will check CBC, TSH.  Hx GDM, Abnl 1 hour and 1 of 4 abnl on 3 hour. Checking BS:  Fasting 75-97  (only 2 values > 95).  PP 96-174 (14/39 over 120). Will start glyburide 2.5 mg in AM. Diabetes education Monday, f/u appt a week from Monday in [facility]. [place] for f/u growth. Start antenatal testing.  Wants BTL - no insurance. Recommended pt get financial application and cost of surgery.

## 2011-12-11 ENCOUNTER — Other Ambulatory Visit (INDEPENDENT_AMBULATORY_CARE_PROVIDER_SITE_OTHER): Payer: Self-pay

## 2011-12-11 ENCOUNTER — Ambulatory Visit (HOSPITAL_COMMUNITY)
Admission: RE | Admit: 2011-12-11 | Discharge: 2011-12-11 | Disposition: A | Discharge status: Home / Self Care | Payer: Self-pay | Source: Ambulatory Visit | Attending: Family Medicine | Admitting: Family Medicine

## 2011-12-11 ENCOUNTER — Encounter: Payer: Self-pay | Attending: Obstetrics & Gynecology | Admitting: Dietician

## 2011-12-11 DIAGNOSIS — O0990 Supervision of high risk pregnancy, unspecified, unspecified trimester: Secondary | ICD-10-CM

## 2011-12-11 DIAGNOSIS — O99810 Abnormal glucose complicating pregnancy: Secondary | ICD-10-CM

## 2011-12-11 DIAGNOSIS — Z3689 Encounter for other specified antenatal screening: Secondary | ICD-10-CM | POA: Insufficient documentation

## 2011-12-11 DIAGNOSIS — Z713 Dietary counseling and surveillance: Secondary | ICD-10-CM | POA: Insufficient documentation

## 2011-12-11 LAB — POCT URINALYSIS DIP (DEVICE)
Bilirubin Urine: NEGATIVE
Glucose, UA: NEGATIVE mg/dL
Ketones, ur: NEGATIVE mg/dL
Nitrite: NEGATIVE
pH: 6 (ref 5.0–8.0)

## 2011-12-11 LAB — CBC
HCT: 32.3 % — ABNORMAL LOW (ref 36.0–46.0)
Hemoglobin: 11 g/dL — ABNORMAL LOW (ref 12.0–15.0)
RDW: 13.5 % (ref 11.5–15.5)
WBC: 7.9 10*3/uL (ref 4.0–10.5)

## 2011-12-11 LAB — TSH: TSH: 1.326 u[IU]/mL (ref 0.350–4.500)

## 2011-12-11 LAB — RPR

## 2011-12-11 NOTE — Progress Notes (Deleted)
Diabetes Education:  Seen today for meter malfunction.  Dropped her meter on floor and since dropping the meter has gotten constant error messages.  Did not bring the meter with her to clinic.  With the assistance of Raynelle Fanning the [place] interpreter, I provided her with a new True Track meter kit AVW:[id number] Exp: 2013/11/20 and 1 box of strips: Lot: [id number]  Exp: 2013/12/29.  On return demonstration, her post-breakfast glucose level was 97 mg/dl. She reported she had taken her medication this morning.  Maggie Hannalee Castor, RN, RD, CDE

## 2011-12-11 NOTE — Progress Notes (Unsigned)
Diabetes Education:  Seen today for issues with higher than normal post-meal blood glucose levels.  Fasting levels for the last 2 weeks range 77-86-99.  Post-breakfast levels:90-100-84-108:  Post Lunch levels:114-139-92-130-174-:   Post Dinner levels:102-103-121-126-146-147.  Has started on oral medication in the AM she reports.  Since then, she has had better control of her glucose levels.   She complained that at times her meter readings were high ans when she checked her glucose using her father's True Track Meter, her levels were much loser.  We did a comparison with my Accu-Chek Nano meter vs her True Track.  Results:  Accu-Chek Nano = 85 mg/dl and her True Track = 89 mg/dl.  She is to see me if she has further problems.  Maggie Myiah Petkus, RN, RD, CDE

## 2011-12-13 LAB — CULTURE, OB URINE: Colony Count: >=100000

## 2011-12-14 ENCOUNTER — Ambulatory Visit (INDEPENDENT_AMBULATORY_CARE_PROVIDER_SITE_OTHER): Payer: Self-pay | Admitting: General Practice

## 2011-12-14 VITALS — BP 113/62 | Wt 168.2 lb

## 2011-12-14 DIAGNOSIS — O99810 Abnormal glucose complicating pregnancy: Secondary | ICD-10-CM

## 2011-12-14 NOTE — Progress Notes (Signed)
Pulse: 86

## 2011-12-18 ENCOUNTER — Ambulatory Visit (INDEPENDENT_AMBULATORY_CARE_PROVIDER_SITE_OTHER): Payer: Self-pay | Admitting: Obstetrics & Gynecology

## 2011-12-18 VITALS — BP 104/53 | Temp 96.5°F | Wt 165.8 lb

## 2011-12-18 DIAGNOSIS — O99810 Abnormal glucose complicating pregnancy: Secondary | ICD-10-CM

## 2011-12-18 LAB — POCT URINALYSIS DIP (DEVICE)
Bilirubin Urine: NEGATIVE
Glucose, UA: NEGATIVE mg/dL
Hgb urine dipstick: NEGATIVE
Nitrite: NEGATIVE
Urobilinogen, UA: 0.2 mg/dL (ref 0.0–1.0)

## 2011-12-18 NOTE — Progress Notes (Signed)
Pulse-85 Patient reports pelvic pain and occasional non-painful UCs.

## 2011-12-18 NOTE — Progress Notes (Signed)
FBS 75-100, usually <90.Marland Kitchen PP 86-121 with occas low and up to 147. NST reactive today

## 2011-12-18 NOTE — Patient Instructions (Signed)
Diabetes mellitus gestacional (Gestational Diabetes Mellitus) La diabetes mellitus gestacional se produce slo durante el embarazo. Aparece cuando el organismo no puede controlar adecuadamente la glucosa (azcar) que aumenta en la sangre despus de comer. Durante el embarazo, se produce una resistencia a la insulina (sensibilidad reducida a la insulina) debido a la liberacin de hormonas por parte de la placenta. Generalmente, el pncreas de una mujer embarazada produce la cantidad suficiente de insulina para vencer esa resistencia. Sin embargo, en la diabetes gestacional, hay insulina pero no cumple su funcin adecuadamente. Si la resistencia es lo suficientemente grave como para que el pncreas no produzca la cantidad de insulina suficiente, la glucosa extra se acumula en la sangre.  QUINES TIENEN RIESGO DE DESARROLLAR DIABETES GESTACIONAL?  Las mujeres con historia de diabetes en la familia.  Las mujeres de ms de 25 aos.  Las que presentan sobrepeso.  Las mujeres que pertenecen a ciertos grupos tnicos (latinas, afroamericanas, norteamericanas nativas, asiticas y las originarias de las islas del Pac[employer]. QUE PUEDE OCURRIRLE AL BEB? Si el nivel de glucosa en sangre de la madre es demasiado elevado mientras este embarazada, el nivel extra de azcar pasar por el cordn umbilical hacia el beb. Algunos de los problemas del beb pueden ser:  Beb demasiado grande: si el nio recibe demasiada azcar, puede aumentar mucho de peso. Esto puede hacer que sea demasiado grande para nacer por parto normal (vaginal) por lo que ser necesario realizar una cesrea.  Bajo nivel de glucosa (hipoglucemia): el beb produce insulina extra en respuesta a la excesiva cantidad de azcar que obtiene de la madre. Cuando el beb nace y ya no necesita insulina extra, su nivel de azcar en sangre puede disminuir.  Ictericia (coloracin amarillenta de la piel y los ojos): esto es bastante frecuente en los bebs. La  causa es la acumulacin de una sustancia qumica denominada bilirrubina. No siempre es un trastorno grave, pero se observa con frecuencia en los bebs cuyas madres sufren diabetes gestacional. RIESGOS PARA LA MADRE Las mujeres que han sufrido diabetes gestacional pueden tener ms riesgos para algunos problemas como:  Preeclampsia o toxemia, incluyendo problemas con hipertensin arterial. La presin arterial y los niveles de protenas en la orina deben controlarse con frecuencia.  Infecciones  Parto por cesrea.  Aparicin de diabetes tipo 2 en una etapa posterior de la vida. Alrededor del 30% al 50% sufrir diabetes posteriormente, especialmente las que son obesas. DIAGNSTICO Las hormonas que causan resistencia a la insulina tienen su mayor nivel alrededor de las 24 a 28 semanas del embarazo. Si se experimentan sntomas, stos son similares a los sntomas que normalmente aparecen durante el embarazo.  La diabetes mellitus gestacional generalmente se diagnostica por medio de un mtodo en dos partes: 1. Despus de la 24 a 28 semanas de embarazo, la mujer debe beber una solucin que contiene glucosa y realizar un anlisis de sangre. Si el nivel de glucosa es elevado, la realizarn un segundo anlisis. 2. La prueba oral de tolerancia a la glucosa, que dura aproximadamente tres horas. Despus de realizar ayuno durante la noche, se controla nivel de glucosa en sangre. La mujer bebe una solucin que contiene glucosa y le realizan anlisis de glucosa en sangre cada hora. Si la mujer tiene factores de riesgos para la diabetes mellitus gestacional, el mdico podr indicar el anlisis antes de las 24 semanas de embarazo. TRATAMIENTO El tratamiento est dirigido a mantener la glucosa en sangre de la madre en un nivel normal y puede incluir:  La   planificacin de los alimentos.  Recibir insulina u otro medicamento para controlar el nivel de glucosa en sangre.  La prctica de ejercicios.  Llevar un  registro diario de los alimentos que consume.  Control y registro de los niveles de glucosa en sangre.  Control de los niveles de cetona en la orina, aunque esto ya no se considera necesario en la mayora de los embarazos. INSTRUCCIONES PARA EL CUIDADO DOMICILIARIO Mientras est embarazada:  Siga los consejos de su mdico relacionados con los controles prenatales, la planificacin de la comida, la actividad fsica, los medicamentos, vitaminas, los anlisis de sangre y otras pruebas y las actividades fsicas.  Lleve un registro de las comidas, las pruebas de glucosa en sangre y la cantidad de insulina que recibe (si corresponde). Muestre todo al profesional en cada consulta mdica prenatal.  Si sufre diabetes mellitus gestacional, podr tener problemas de hipoglucemia (nivel bajo de glucosa en sangre). Podr sospechar este problema si se siente repentinamente mareada, tiene temblores y/o se siente dbil. Si cree que esto le est ocurriendo, y tiene un medidor de glucosa, mida su nivel de glucosa en sangre. Siga los consejos de su mdico sobre el modo y el momento de tratar su nivel de glucosa en sangre. Generalmente se sigue la regla 15:15 Consuma 15 g de hidratos de carbono, espere 15 minutos y vuelva controlar el nivel de glucosa en sangre.. Ejemplos de 15 g de hidratos de carbono son:  1 taza de leche descremada.   taza de jugo.  3-4 tabletas de glucosa.  5-6 caramelos duros.  1 caja pequea de pasas de uva.   taza de gaseosa comn.  Mantenga una buena higiene para evitar infecciones.  No fume. SOLICITE ATENCIN MDICA SI:  Observa prdida vaginal con o sin picazn.  Se siente ms dbil o cansada que lo habitual.  Transpira mucho.  Tiene un aumento de peso repentino, 2,5 kg o ms en una semana.  Pierde peso, 1.5 kg o ms en una semana.  Su nivel de glucosa en sangre es elevado, necesita instrucciones. SOLICITE ATENCIN MDICA DE INMEDIATO SI:  Sufre una cefalea  intensa.  Se marea o pierde el conocimiento  Presenta nuseas o vmitos.  Se siente desorientada confundida.  Sufre convulsiones.  Tiene problemas de visin.  Siente dolor en el estmago.  Presenta una hemorragia vaginal abundante.  Tiene contracciones uterinas.  Tiene una prdida importante de lquido por la vagina DESPUS QUE NACE EL BEB:  Concurra a todos los controles de seguimiento y realice los anlisis de sangre segn las indicaciones de su mdico.  Mantenga un estilo de vida saludable para evitar la diabetes en el futuro. Aqu se incluye:  Siga el plan de alimentacin saludable.  Controle su peso.  Practique actividad fsica y descanse lo necesario.  No fume.  Amamante a su beb mientras pueda. Esto disminuir la probabilidad de que usted y su beb sufran diabetes posteriormente. Para ms informacin acerca de la diabetes, visite la pgina web de la American Diabetes Association: www.americandiabetesassociation.org. Para ms informacin acerca de la diabetes gestacional cite la pgina web del American Congress of Obstetricians and Gynecologists en: www.acog.org. Document Released: 10/19/2004 Document Revised: 04/03/2011 Exit[employer] Patient Information 2013 [employer].  

## 2011-12-20 ENCOUNTER — Ambulatory Visit (INDEPENDENT_AMBULATORY_CARE_PROVIDER_SITE_OTHER): Payer: Self-pay | Admitting: *Deleted

## 2011-12-20 VITALS — BP 111/56 | Wt 165.3 lb

## 2011-12-20 DIAGNOSIS — O99810 Abnormal glucose complicating pregnancy: Secondary | ICD-10-CM

## 2011-12-20 NOTE — Progress Notes (Signed)
P = 76 

## 2011-12-20 NOTE — Progress Notes (Signed)
NST reviewed and reactive.  

## 2011-12-25 ENCOUNTER — Ambulatory Visit (INDEPENDENT_AMBULATORY_CARE_PROVIDER_SITE_OTHER): Payer: Self-pay | Admitting: Obstetrics & Gynecology

## 2011-12-25 VITALS — BP 100/53 | Temp 97.0°F | Wt 166.9 lb

## 2011-12-25 DIAGNOSIS — IMO0001 Reserved for inherently not codable concepts without codable children: Secondary | ICD-10-CM

## 2011-12-25 DIAGNOSIS — O99810 Abnormal glucose complicating pregnancy: Secondary | ICD-10-CM

## 2011-12-25 DIAGNOSIS — O0990 Supervision of high risk pregnancy, unspecified, unspecified trimester: Secondary | ICD-10-CM

## 2011-12-25 DIAGNOSIS — IMO0002 Reserved for concepts with insufficient information to code with codable children: Secondary | ICD-10-CM

## 2011-12-25 LAB — POCT URINALYSIS DIP (DEVICE)
Bilirubin Urine: NEGATIVE
Glucose, UA: NEGATIVE mg/dL
Nitrite: NEGATIVE

## 2011-12-25 NOTE — Progress Notes (Signed)
Pulse: 81

## 2011-12-25 NOTE — Progress Notes (Signed)
NST performed today was reviewed and was found to be reactive; AFI 13.7 cm.  Continue recommended antenatal testing and prenatal care. CBGs are within range.  Continue Glyburide.  Pelvic cultures next week. No other complaints or concerns.  Fetal movement and labor precautions reviewed.

## 2011-12-25 NOTE — Patient Instructions (Signed)
Return to clinic for any obstetric concerns or go to [facility] for evaluation  

## 2011-12-25 NOTE — Progress Notes (Signed)
Pt reports decr. FM x2 days- felt FM during NST

## 2011-12-28 ENCOUNTER — Ambulatory Visit (INDEPENDENT_AMBULATORY_CARE_PROVIDER_SITE_OTHER): Payer: Self-pay | Admitting: *Deleted

## 2011-12-28 VITALS — BP 107/57 | Temp 98.9°F | Wt 167.3 lb

## 2011-12-28 DIAGNOSIS — O99810 Abnormal glucose complicating pregnancy: Secondary | ICD-10-CM

## 2011-12-28 NOTE — Progress Notes (Signed)
NST from 12/14/11  reactive

## 2011-12-28 NOTE — Progress Notes (Signed)
P=69,  Here for NST, Used [employer]. C/o cold symptoms including runny nose, sore throat , swollen throat. Given list of medicines she may take.

## 2012-01-01 ENCOUNTER — Other Ambulatory Visit: Payer: Self-pay

## 2012-01-04 ENCOUNTER — Ambulatory Visit (INDEPENDENT_AMBULATORY_CARE_PROVIDER_SITE_OTHER): Payer: Self-pay | Admitting: Family Medicine

## 2012-01-04 VITALS — BP 107/58 | Temp 98.4°F | Wt 169.0 lb

## 2012-01-04 DIAGNOSIS — O99810 Abnormal glucose complicating pregnancy: Secondary | ICD-10-CM

## 2012-01-04 DIAGNOSIS — J329 Chronic sinusitis, unspecified: Secondary | ICD-10-CM

## 2012-01-04 MED ORDER — GLYBURIDE 2.5 MG PO TABS: 2.5000 mg | ORAL_TABLET | Freq: Every day | ORAL | Status: DC

## 2012-01-04 MED ORDER — AZITHROMYCIN 250 MG PO TABS: ORAL_TABLET | ORAL | Status: DC

## 2012-01-04 NOTE — Patient Instructions (Signed)
May try Allegra and sudafed. Sinusitis  (Sinusitis) La sinusitis es el enrojecimiento, dolor e hinchazn (inflamacin) de los senos paranasales. Los senos paranasales son bolsas de aire que se encuentran dentro de los [employer] del rostro (por debajo de los ojos, en la mitad de la frente o por encima de los ojos). En los senos paranasales sanos, el moco es capaz de Sales executive y el aire circula a travs de ellos en su camino hacia la Clinical cytogeneticist. Sin embargo, cuando se [place], el moco y el aire [place]. Esto hace que se desarrollen bacterias y otros grmenes y originen una infeccin.   La sinusitis puede desarrollarse rpidamente y durar slo un tiempo corto (aguda) o continuar por un perodo largo (crnica).. La sinusitis que dura ms de 12 semanas se considera crnica.  CAUSAS  Las causas de la sinusitis son:   [place].  Las [employer], como el desplazamiento del cartlago que separa las fosas nasales (desvo del tabique) pueden disminuir el flujo de aire por la nariz y los senos paranasales y Audiological scientist su drenaje.  Las alteraciones funcionales, como cuando los pequeos pelos (cilias) que se encuentran en los senos nasales y ayudan a eliminar la mucosidad no funcionan correctamente o no estn presentes. SNTOMAS  Los sntomas de sinusitis aguda y crnica son los mismos. Los sntomas principales son Chief Technology Officer y la presin alrededor de los senos paranasales afectados. Otros sntomas son:   Careers information officer.  Dolor de odos  Dolor de [place].  Mal aliento.  Disminucin del sentido del olfato y del gusto.  Tos, que empeora al [employer]  Fatiga.  Grant Ruts.  Drenaje de moco espeso por la nariz, que generalmente es de color verde y puede contener pus (purulento).  Hinchazn y calor en los senos paranasales afectados. DIAGNSTICO  El mdico le har un examen fsico. Durante el examen, el mdico:   Revisar su nariz buscando signos de crecimientos anormales en las  fosas nasales (plipos nasales).  Palpar los senos paranasales afectados para buscar signos de infeccin.  Observar el interior de los senos paranasales (endoscopa) con un dispositivo luminoso especial (endoscopio) con el que tomar imgenes insertndolo en los senos paranasales. Si el mdico sospecha que usted sufre sinusitis crnica, podr indicar una o ms de las siguientes pruebas:   Pruebas de Programmer, multimedia.  Cultivo de secreciones nasales: tomar Lauris Poag del moco nasal y lo enviar a un laboratorio para detectar bacterias.  Citologa nasal: el mdico tomar [place] de moco de la nariz para determinar si la sinusitis que usted sufre est relacionada con Vella Raring. TRATAMIENTO  La mayora de los casos de sinusitis aguda se deben a una infeccin viral y se resuelven espontneamente dentro de los [address]. En algunos casos se recetan medicamentos para [employer] (analgsicos, descongestivos, aerosoles nasales con corticoides o aerosoles salinos).  Sin embargo, para la sinusitis por infeccin bacteriana, Chief Operating Officer. Los antibiticos son medicamentos que destruyen las bacterias que causan la infeccin.  Rara vez la sinusitis tiene su origen en una infeccin por hongos. . En estos casos, el mdico le recetar un medicamento antifngico.  Para algunos casos de sinusitis crnica, es necesario someterse a [place]. Generalmente se trata de [employer] la sinusitis se repite ms de 3 veces al ao, a pesar de otros tratamientos.  INSTRUCCIONES PARA EL CUIDADO EN EL HOGAR   Beba gran cantidad de lquidos. Los lquidos ayudan a Optometrist moco para que drene ms fcilmente  de los senos paranasales.  Use un humidificador.  Inhale vapor de 3 a 4 veces al da (por ejemplo, sintese en el bao con la ducha abierta).  Aplique un pao tibio y hmedo en el rostro 3  4 veces al da, o segn las indicaciones de su mdico.  Use un aerosol nasal salino para  ayudar a Environmental education officer y [employer] senos nasales.  Tome medicamentos de venta libre o recetados para Acupuncturist, Environmental health practitioner o la fiebre slo segn las indicaciones de su mdico. SOLICITE ATENCIN MDICA DE INMEDIATO SI:   Siente ms dolor o sufre dolores de cabeza intensos.  Tiene nuseas, vmitos o somnolencia.  Observa hinchazn alrededor del rostro.  Tiene problemas de visin.  Presenta rigidez en el cuello.  Tiene dificultad para respirar. ASEGRESE DE QUE:   Comprende estas instrucciones.  Controlar su enfermedad.  Solicitar ayuda de inmediato si no mejora o si empeora. Document Released: 10/19/2004 Document Revised: 04/03/2011 [facility] Patient Information 2013 [place], [place].

## 2012-01-04 NOTE — Progress Notes (Signed)
NST reviewed and reactive. C/o of face pain, ear pain and teeth pain.  URI x 7 days.  Having congestion.--maxillary sinusitis--treat with antibiotics. FBS 75-94 2 hr pp 65-138 Needs refill on Diabetes med.

## 2012-01-04 NOTE — Progress Notes (Signed)
P = 87     Pt reports continued sx of URI- states she took whole box of Mucinex but no relief of runny nose. Also she now has pain in her Rt ear.  Pt denies cough or chest congestion- only pain from Rt her ear to throat, teeth pain and runny nose.  Report of pt c/o given to Dr. Shawnie Pons.

## 2012-01-08 ENCOUNTER — Ambulatory Visit (INDEPENDENT_AMBULATORY_CARE_PROVIDER_SITE_OTHER): Payer: Self-pay | Admitting: Family Medicine

## 2012-01-08 VITALS — BP 102/60 | Temp 97.9°F | Wt 169.6 lb

## 2012-01-08 DIAGNOSIS — O99810 Abnormal glucose complicating pregnancy: Secondary | ICD-10-CM

## 2012-01-08 LAB — POCT URINALYSIS DIP (DEVICE)
Bilirubin Urine: NEGATIVE
Glucose, UA: NEGATIVE mg/dL
Ketones, ur: NEGATIVE mg/dL
Leukocytes, UA: NEGATIVE
Protein, ur: NEGATIVE mg/dL
Specific Gravity, Urine: >=1.03 (ref 1.005–1.030)

## 2012-01-08 LAB — OB RESULTS CONSOLE GBS: GBS: NEGATIVE

## 2012-01-08 NOTE — Progress Notes (Signed)
U/S growth next week IOL scheduled Cultures today NST reviewed and reactive. FBS 66-94 1 out of range 2 hr pp 65-138 2 out of range

## 2012-01-08 NOTE — Progress Notes (Signed)
[place] growth scheduled on 12/23 @ 0845.  IOL scheduled 12/26 @ 0730

## 2012-01-08 NOTE — Patient Instructions (Addendum)
Induction of Labor is scheduled for 12/26 at 7:30 am Diabetes mellitus gestacional (Gestational Diabetes Mellitus) La diabetes mellitus gestacional se produce slo durante el embarazo. Aparece cuando el organismo no puede controlar adecuadamente la glucosa (azcar) que aumenta en la sangre despus de comer. Durante el [place], se produce una resistencia a la insulina (sensibilidad reducida a la insulina) debido a la liberacin de hormonas por parte de la placenta. Generalmente, el pncreas de una mujer embarazada produce la cantidad suficiente de insulina para vencer esa resistencia. Sin embargo, en la diabetes gestacional, hay insulina pero no cumple su funcin adecuadamente. Si la resistencia es lo suficientemente grave como para que el pncreas no produzca la cantidad de insulina suficiente, la glucosa extra se acumula en la sangre.  Devota Pace RIESGO DE DESARROLLAR DIABETES GESTACIONAL?  Las mujeres con historia de diabetes en la familia.  Las mujeres de ms de [address]  Las que presentan sobrepeso.  Las [employer] pertenecen a ciertos grupos tnicos (latinas, afroamericanas, norteamericanas nativas, asiticas y las originarias de las islas del Pacfico. QUE PUEDE OCURRIRLE AL BEB? Si el nivel de glucosa en sangre de la madre es demasiado elevado mientras este [place], el nivel extra de azcar pasar por el cordn umbilical hacia el beb. Algunos de los problemas del beb pueden ser:  Beb demasiado grande: si el nio recibe Chief Strategy Officer, puede aumentar mucho de [place]. Esto puede hacer que sea demasiado grande para nacer por parto normal (vaginal) por lo que ser necesario realizar una cesrea.  Bajo nivel de glucosa (hipoglucemia): el beb produce insulina extra en respuesta a la excesiva cantidad de azcar que obtiene de [employer]. Cuando el beb nace y ya no necesita insulina extra, su nivel de azcar en sangre puede disminuir.  Ictericia (coloracin amarillenta de la piel  y los ojos): esto es bastante frecuente en los bebs. La causa es la acumulacin de una sustancia qumica denominada bilirrubina. No siempre es un trastorno grave, pero se observa con frecuencia en los bebs cuyas madres sufren diabetes gestacional. RIESGOS PARA LA MADRE Las mujeres que han sufrido diabetes gestacional pueden tener ms riesgos para algunos problemas como:  Preeclampsia o toxemia, incluyendo problemas con hipertensin arterial. La presin arterial y los niveles de protenas en la orina deben controlarse con frecuencia.  Infecciones  Parto por cesrea.  Aparicin de diabetes tipo 2 en una etapa posterior de la vida. Alrededor del 30% al 50% sufrir diabetes posteriormente, especialmente las que son obesas. DIAGNSTICO Las hormonas que causan resistencia a la insulina tienen su mayor nivel alrededor de las 24 a 28 semanas del Psychiatrist. Si se experimentan sntomas, stos son similares a los sntomas que normalmente aparecen durante el embarazo.  La diabetes mellitus gestacional generalmente se diagnostica por medio de un mtodo en dos partes: 1. Despus de la 24 a 28 semanas de Psychiatrist, la mujer debe beber una solucin que contiene glucosa y Education officer, environmental un anlisis de [place]. Si el nivel de glucosa es elevado, la realizarn un segundo [place]. 2. La prueba oral de tolerancia a la glucosa, que dura aproximadamente tres horas. Despus de realizar ayuno durante la noche, se controla nivel de glucosa en sangre. La mujer bebe una solucin que contiene glucosa y Chief Executive Officer realizan anlisis de glucosa en sangre cada hora. Si la mujer tiene factores de riesgos para la diabetes mellitus gestacional, el mdico podr Programme researcher, broadcasting/film/video anlisis antes de las 24 semanas de [place]. TRATAMIENTO El tratamiento est dirigido a Insurance underwriter en sangre de la  madre en un nivel normal y puede incluir:  La planificacin de los alimentos.  Recibir insulina u otro medicamento para Sales executive nivel de glucosa en  [place].  La prctica de ejercicios.  Llevar un registro diario de los alimentos que consume.  Control y Engineer, maintenance (IT) de los niveles de glucosa en [place].  Control de los niveles de cetona en la [place], [place] esto ya no se considera necesario en la mayora de los [place]. INSTRUCCIONES PARA EL CUIDADO DOMICILIARIO Mientras est embarazada:  Siga los consejos de su mdico relacionados con los controles prenatales, la planificacin de la comida, la actividad fsica, los [place], vitaminas, los anlisis de sangre y otras pruebas y las actividades fsicas.  Lleve un registro de las comidas, las pruebas de glucosa en sangre y la cantidad de insulina que recibe (si corresponde). Muestre todo al profesional en cada consulta mdica prenatal.  Si sufre diabetes mellitus gestacional, podr tener problemas de hipoglucemia (nivel bajo de glucosa en sangre). Podr sospechar este problema si se siente repentinamente mareada, tiene temblores y/o se siente dbil. Si cree que esto le est ocurriendo, y tiene un medidor de glucosa, mida su nivel de Event organiser. Siga los consejos de su mdico sobre el modo y el momento de tratar su nivel de glucosa en sangre. Generalmente se sigue la regla 15:15 Consuma 15 g de hidratos de carbono, espere 15 minutos y Programmer, systems el nivel de glucosa en [place].Barbara Cower de 15 g de hidratos de carbono son:  1 taza de [employer].   taza de jugo.  3-4 tabletas de glucosa.  5-6 caramelos duros.  1 caja pequea de pasas de uva.   taza de gaseosa comn.  Mantenga una buena higiene para evitar infecciones.  No fume. SOLICITE ATENCIN MDICA SI:  Observa prdida vaginal con o sin picazn.  Se siente ms dbil o cansada que lo habitual.  Primus [place]ravo.  Tiene un aumento de peso repentino, 2,5 kg o ms en una semana.  Pierde peso, 1.5 kg o ms en una semana.  Su nivel de glucosa en sangre es elevado, necesita instrucciones. SOLICITE ATENCIN  MDICA DE INMEDIATO SI:  Sufre una cefalea intensa.  Se marea o pierde el conocimiento  Presenta nuseas o vmitos.  Se siente desorientada confundida.  Sufre convulsiones.  Tiene problemas de visin.  Siente Physiological scientist.  Presenta una hemorragia vaginal abundante.  Tiene contracciones uterinas.  Tiene una prdida importante de lquido por la vagina DESPUS QUE NACE EL BEB:  Concurra a todos los controles de seguimiento y Clinical biochemist los anlisis de sangre segn las indicaciones de su mdico.  Mantenga un estilo de vida saludable para evitar la diabetes en el futuro. Aqu se incluye:  Siga el plan de alimentacin saludable.  Controle su peso.  Practique actividad fsica y descanse lo necesario.  No fume.  Amamante a su beb mientras pueda. Esto disminuir la probabilidad de que usted y su beb sufran diabetes posteriormente. Para ms informacin acerca de la diabetes, visite la pgina web de Holiday representative Diabetes Association: [email or web address].cy. Para ms informacin acerca de la diabetes gestacional cite la pgina web del [employer] of Obstetricians and Gynecologists en: [email or web address]. Document Released: 10/19/2004 Document Revised: 04/03/2011 [facility] Patient Information 2013 [place], [place].  Lactancia materna  (Breastfeeding) Decidir Museum/gallery exhibitions officer es una de las mejores elecciones que puede hacer por usted y su beb. La informacin que se brinda a Psychologist, clinical dar una breve visin de los beneficios de la Tour manager as  como de las dudas ms frecuentes alrededor de ella.  LOS BENEFICIOS DE AMAMANTAR  Para el beb   La primera leche (calostro ) ayuda al mejor funcionamiento del sistema digestivo del beb.   La leche tiene anticuerpos que provienen de la madre y que ayudan a prevenir las infecciones en el beb.   El beb tiene una menor incidencia de asma, alergias y del sndrome de muerte sbita del lactante (SMSL).   Los  nutrientes en la [place] materna son mejores para el beb que los preparados para lactantes y la [place] materna ayuda a un mejor desarrollo del cerebro del beb.   Los bebs amamantados tienen menos gases, clicos y estreimiento.  Para la mam   La lactancia materna favorece el desarrollo de un vnculo muy especial entre la madre y el beb.   Es ms conveniente, siempre disponible y a Landscape architect y Film/video editor.   Consume caloras en la madre y la ayuda a perder el peso ganado durante el [place].   Favorece la contraccin del tero a su tamao normal, de manera ms rpida y [employer] las hemorragias luego del [place].   Las [employer] Holdings que amamantan tienen menor riesgo de Geophysical data processor de mama.  FRECUENCIA DEL AMAMANTAMIENTO   Un beb sano, nacido a trmino, puede amamantarse con tanta frecuencia como cada hora, o espaciar las comidas cada tres horas.   Observe al beb cuando manifieste signos de hambre. Amamante a su beb si muestra signos de hambre. Esta frecuencia variar de un beb a otro.   Amamntelo tan seguido como el beb lo solicite, o cuando usted sienta la necesidad de Paramedic sus [place].   Despierte al beb si han pasado 3  4 horas desde la ltima comida.   El amamantamiento frecuente la ayudar a producir ms [place] y a Education officer, community de Engineer, mining en los pezones e hinchazn de las [place].  POSICIN DEL BEBE PARA EL AMAMANTAMIENTO   Ya sea que se encuentre [place] o sentada, asegrese que el abdomen del beb enfrente el suyo.   Sostenga la mama con el pulgar por arriba y los otros 4 dedos por debajo. Asegrese que sus dedos se encuentren lejos del pezn y de la boca del beb.   Empuje suavemente los labios del beb con el pezn o con el dedo.   Cuando la boca del beb se abra lo suficiente, introduzca el pezn y la areola tanto como le sea posible dentro de la boca.   Coloque al beb cerca suyo de modo que su nariz y mejillas toquen las mamas al  [employer].  ALIMENTACIN Y SUCCIN   La duracin de cada comida vara de un beb a otro y de Neomia Dear comida a [place].   El beb debe succionar entre 2 y 3 minutos para que Development worker, international aid. Esto se denomina "bajada". Por este motivo, permita que el nio se alimente en cada mama todo lo que desee. Terminar de mamar cuando haya recibido la cantidad [place] de nutrientes.   Para detener la succin coloque su dedo en la comisura de la boca del nio y Midwife entre sus encas antes de quitarle la mama de la boca. Esto la ayudar a English as a second language teacher.  COMO SABER SI EL BEB OBTIENE LA SUFICIENTE LECHE MATERNA  Preguntarse si el beb obtiene la cantidad suficiente de [place] es una preocupacin frecuente [employer]. Puede asegurarse que el beb tiene la leche suficiente si:   El beb succiona activamente y usted  escucha que traga .   El beb parece estar relajado y satisfecho despus de Psychologist, clinical.   El nio se alimenta al menos 8 a 12 veces en 24 horas. Alimntelo hasta que se desprenda por sus propios medios o se quede dormido en la primera mama (al menos durante 10 a 20 minutos), luego ofrzcale el otro lado.   El beb moja 5 a 6 paales desechables (6 a 8 paales de tela) en 24 horas cuando tiene 5  6 das de vida.   Tiene al Lowe[employer] Companies 3 a 4 deposiciones todos los B[employer]on[employer] and Company primeros meses. La materia fecal debe ser blanda y [place].   El beb debe aumentar 4 a 6 libras (120 a 170 gr.) por semana despus de los 4 [address] de vida.   Siente que las mamas se ablandan despus de amamantar  REDUCIR LA CONGESTIN DE LAS MAMAS   Durante la primera semana despus del [place], usted puede experimentar hinchazn en las mamas. Cuando las mamas estn congestionadas, se sienten calientes, llenas y molestas al tacto. Puede reducir la congestin si:   Lo amamanta frecuentemente, cada 2-3 horas. Las mams que [employer] pronto y con frecuencia tienen menos problemas de [place].   Coloque  compresas de hielo en sus mamas durante 10-20 minutos entre cada amamantamiento. Esto ayuda a Building services engineer. Envuelva las bolsas de hielo en una toalla liviana para proteger su piel. Las bolsas de vegetales congelados funcionan bien para este propsito.   Tome una ducha tibia o aplique compresas hmedas calientes en las mamas durante 5 a 10 minutos antes de cada vez que [place]. Esto aumenta la circulacin y [place] a que la [place].   Masajee suavemente la mama antes y Psychologist, sport and exercise. Con las puntas de los dedos, masajee desde la pared torcica hacia abajo hasta llegar al pezn, con movimientos circulares.   Asegrese que el nio vaca al menos una mama antes de cambiar de lado.   Use un sacaleche para vaciar la mama si el beb se duerme o no se alimenta bien. Tambin podr [employer] con esa bomba si tiene que volver al trabajo o siente que las mamas estn congestionadas.   Evite los biberones, chupetes o complementar la alimentacin con agua o jugos en lugar de la [place]. La leche materna es todo el alimento que el beb necesita. No es necesario que el nio ingiera agua o preparados de bibern. [place]ann [place], es lo mejor para ayudar a que las mamas produzcan ms Agency. no darle suplementos al [employer] las primeras semanas.   Verifique que el beb se encuentra en la posicin correcta mientras lo alimenta.   Use un sostn que soporte bien sus mamas y [employer] que tienen aro.   Consuma una dieta balanceada y beba lquidos en cantidad.   Descanse con frecuencia, reljese y tome sus vitaminas prenatales para evitar la fatiga, el estrs y la anemia.  Si sigue estas indicaciones, la congestin debe mejorar en 24 a 48 horas. Si an tiene dificultades, consulte a Barista.  CUDESE USTED MISMA  Cuide sus mamas.   Bese o dchese diariamente.   Evite usar [employer].   Comience a amamantar del lado izquierdo en una comida y del  lado derecho en la siguiente.   Notar que aumenta el flujo de [employer] a los 2 a 5 [address] despus del [address]. Puede sentir algunas molestias por la congestin, lo que hace que sus mamas estn  duras y sensibles. La congestin disminuye en 24 a 48 horas. Mientras tanto, aplique toallas hmedas calientes durante 5 a 10 minutos antes de amamantar. Un masaje suave y la extraccin de un poco de leche antes de Museum/gallery exhibitions officer ablandarn las mamas y har ms fcil que el beb se agarre.   Use un buen sostn y seque al aire los pezones durante 3 a 4 minutos luego de Wellsite geologist.   Solo utilice apsitos de algodn.   Utilice lanolina [employer] pezones luego de [place]. No necesita lavarlos luego de alimentar al [employer]. Otra opcin es exprimir algunas gotas de [place] y [employer] pezones.  Cumpla con estos cuidados   Consuma alimentos bien balanceados y refrigerios nutritivos.   Dixie Dials, jugos de fruta y agua para Warehouse manager sed (alrededor de 8 vasos por Futures trader).   Descanse lo suficiente.  Evite los alimentos que usted note que pueden afectar al beb.  SOLICITE ATENCIN MDICA SI:   Tiene dificultad con la lactancia materna y [place].   Tiene una zona de color rojo, dura y dolorosa en la mama que se acompaa de [place].   El beb est muy somnoliento como para alimentarse bien o tiene problemas para dormir.   Su beb moja menos de 6 paales al [address], a los 5 [address] de vida.   La piel del beb o la parte blanca de sus ojos est ms amarilla de lo que estaba en el hospital.   Se siente deprimida.  Document Released: 01/09/2005 Document Revised: 07/11/2011 [facility] Patient Information 2013 [place], [place].

## 2012-01-08 NOTE — Progress Notes (Signed)
[id number] 

## 2012-01-11 ENCOUNTER — Encounter: Payer: Self-pay | Admitting: Family Medicine

## 2012-01-11 ENCOUNTER — Ambulatory Visit (INDEPENDENT_AMBULATORY_CARE_PROVIDER_SITE_OTHER): Payer: Self-pay | Admitting: *Deleted

## 2012-01-11 VITALS — BP 114/64 | Wt 168.3 lb

## 2012-01-11 DIAGNOSIS — O99810 Abnormal glucose complicating pregnancy: Secondary | ICD-10-CM

## 2012-01-11 NOTE — Progress Notes (Signed)
P = 88   Next [place] growth on 12/23,  IOL on 12/26

## 2012-01-12 ENCOUNTER — Telehealth (HOSPITAL_COMMUNITY): Payer: Self-pay | Admitting: *Deleted

## 2012-01-12 ENCOUNTER — Encounter (HOSPITAL_COMMUNITY): Payer: Self-pay | Admitting: *Deleted

## 2012-01-12 NOTE — Telephone Encounter (Signed)
Preadmission screen Interpreter number [phone]

## 2012-01-14 NOTE — Progress Notes (Signed)
NST 01/11/12 reactive 

## 2012-01-15 ENCOUNTER — Ambulatory Visit (INDEPENDENT_AMBULATORY_CARE_PROVIDER_SITE_OTHER): Payer: Self-pay | Admitting: Obstetrics & Gynecology

## 2012-01-15 ENCOUNTER — Ambulatory Visit (HOSPITAL_COMMUNITY)
Admission: RE | Admit: 2012-01-15 | Discharge: 2012-01-15 | Disposition: A | Discharge status: Home / Self Care | Payer: Self-pay | Source: Ambulatory Visit | Attending: Family Medicine | Admitting: Family Medicine

## 2012-01-15 VITALS — BP 114/53 | Wt 170.7 lb

## 2012-01-15 DIAGNOSIS — O0990 Supervision of high risk pregnancy, unspecified, unspecified trimester: Secondary | ICD-10-CM

## 2012-01-15 DIAGNOSIS — O24419 Gestational diabetes mellitus in pregnancy, unspecified control: Secondary | ICD-10-CM

## 2012-01-15 DIAGNOSIS — IMO0001 Reserved for inherently not codable concepts without codable children: Secondary | ICD-10-CM

## 2012-01-15 DIAGNOSIS — IMO0002 Reserved for concepts with insufficient information to code with codable children: Secondary | ICD-10-CM

## 2012-01-15 DIAGNOSIS — Z3689 Encounter for other specified antenatal screening: Secondary | ICD-10-CM | POA: Insufficient documentation

## 2012-01-15 DIAGNOSIS — O99810 Abnormal glucose complicating pregnancy: Secondary | ICD-10-CM | POA: Insufficient documentation

## 2012-01-15 LAB — POCT URINALYSIS DIP (DEVICE)
Ketones, ur: NEGATIVE mg/dL
Protein, ur: NEGATIVE mg/dL
Specific Gravity, Urine: >=1.03 (ref 1.005–1.030)

## 2012-01-15 NOTE — Progress Notes (Signed)
12/23 EFW 3323g/68%, AFI 9.3, cephalic.  NST reactive today.  CBGs mostly with range. IOL on 01/18/12 at 0730, patient is aware.  No other complaints or concerns.  Fetal movement and labor precautions reviewed.

## 2012-01-15 NOTE — Patient Instructions (Signed)
Eleccin del mtodo anticonceptivo  (Contraception Choices) La anticoncepcin (control de la natalidad) es el uso de cualquier mtodo o dispositivo para evitar el embarazo. A continuacin se indican algunos de esos mtodos.  MTODOS HORMONALES   Implante anticonceptivo. Es un tubo plstico delgado que contiene la hormona progesterona. No contiene estrgenos. El mdico inserta el tubo en la parte interna del brazo. El tubo puede permanecer en el lugar durante 3 aos. Despus de los 3 aos debe retirarse. El implante impide que los ovarios liberen vulos (ovulacin), espesa el moco cervical, lo que evita que los espermatozoides ingresen al tero y hace ms delgada la membrana que cubre el interior del tero.  Inyecciones de progesterona sola. Estas inyecciones se administran cada 3 meses para evitar el embarazo. La progesterona sinttica impide que los ovarios liberen vulos. Tambin hace que el moco cervical se espese y modifica el recubrimiento interno del tero. Esto hace ms difcil que los espermatozoides sobrevivan en el tero.  Pldoras anticonceptivas. Las pldoras anticonceptivas contienen estrgenos y progesterona. Actan impidiendo que el vulo se forme en el ovario(ovulacin). Las pldoras anticonceptivas son recetadas por el mdico.Tambin se utilizan para tratar los perodos menstruales abundantes.  Minipldora. Este tipo de pldora anticonceptiva contiene slo hormona progesterona. Deben tomarse todos los das del mes y debe recetarlas el mdico.  Parches anticonceptivos. El parche contiene hormonas similares a las que contienen las pldoras anticonceptivas. Deben cambiarse una vez por semana y se utilizan bajo prescripcin mdica.  Anillo vaginal. Anillo vaginal contiene hormonas similares a las que contienen las pldoras anticonceptivas. Se deja colocado durante tres semanas, se lo retira durante 1 semana y luego se coloca uno nuevo. La paciente debe sentirse cmoda para insertar y  retirar el anillo de la vagina.Es necesaria la receta del mdico.  Anticonceptivos de emergencia. Los anticonceptivos de emergencia son mtodos para evitar un embarazo despus de una relacin sexual sin proteccin. Esta pldora puede tomarse inmediatamente despus de tener relaciones sexuales o hasta 5 das de haber tenido sexo sin proteccin. Es ms efectiva si se toma poco tiempo despus. Los anticonceptivos de emergencia estn disponibles sin prescripcin mdica. Consltelo con su farmacutico. No use los anticonceptivos de emergencia como nico mtodo anticonceptivo. MTODOS DE BARRERA   Condn masculino. Es una vaina delgada (ltex o goma) que se usa en el pene durante el acto sexual. Puede usarse con espermicida para aumentar la efectividad.  Condn femenino. Es una vaina blanda y floja que se adapta suavemente a la vagina antes de las relaciones sexuales.  Diafragma. Es una barrera de ltex redonda y suave que debe ser ajustada por un profesional. Se inserta en la vagina, junto con un gel espermicida. Debe insertarse antes de tener relaciones sexuales. Debe dejar el diafragma colocado en la vagina durante 6 a 8 horas despus de la relacin sexual.  Capuchn cervical. Es una taza de ltex o plstico, redonda y suave que cubre el cuello del tero y debe ser ajustada por un mdico. Puede dejarlo colocado en la vagina hasta 48 horas despus de las relaciones sexuales.  Esponja. Es una pieza blanda y circular de espuma de poliuretano. Contiene un espermicida. Se inserta en la vagina despus de mojarla y antes de las relaciones sexuales.  Espermicidas. Los espermicidas son qumicos que matan o bloquean el esperma y no lo dejan ingresar al cuello del tero y al tero. Vienen en forma de cremas, geles, supositorios, espuma o comprimidos. No es necesario tener receta mdica. Se insertan en la vagina con un aplicador   antes de tener relaciones sexuales. El proceso debe repetirse cada vez que tiene  relaciones sexuales. ANTICONCEPTIVOS INTRAUTERINOS   Dispositivo intrauterino (DIU). Es un dispositivo en forma de T que se coloca en el tero durante el perodo menstrual, para evitar el embarazo. Hay dos tipos:  DIU de cobre. Este tipo de DIU est recubierto con un alambre de cobre y se inserta dentro del tero. El cobre hace que el tero y las trompas de Falopio produzcan un liquido que destruye los espermatozoides. Puede permanecer colocado durante 10 aos.  DIU hormonal. Este tipo de DIU contiene la hormona progestina (progesterona sinttica). La hormona espesa el moco cervical y evita que los espermatozoides ingresen al tero y tambin afina la membrana que cubre el tero para evitar la implantacin del vulo fertilizado. La hormona debilita o destruye los espermatozoides que ingresan al tero. Puede permanecer colocado durante 5 aos. MTODOS ANTICONCEPTIVOS PERMANENTES   Ligadura de trompas en la mujer. La ligadura de trompas en la mujer se realiza sellando, atando u obstruyendo quirrgicamente las trompas de Falopio lo que impide que el vulo descienda hacia el tero.  Esterilizacin masculina. Se realiza atando los conductos por los que pasan los espermatozoides (vasectoma).Esto impide que el esperma ingrese a la vagina durante el acto sexual. Luego del procedimiento, el hombre puede eyacular lquido (semen). MTODOS DE PLANIFICACIN NATURAL   Planificacin familiar natural.  Consiste en no tener relaciones sexuales o usar un mtodo de barrera (condn, diafragma, capuchn cervical) en los das que la mujer podra quedar embarazada.  Mtodo calendario.  Consiste en el seguimiento de la duracin de cada ciclo menstrual y la identificacin de los perodos frtiles.  Mtodo de la ovulacin.  Consiste en evitar las relaciones sexuales durante la ovulacin.  Mtodo sintotrmico. Consiste en evitar las relaciones sexuales en la poca en la que se est ovulando, utilizando un termmetro y  tendiendo en cuenta los sntomas de la ovulacin.  Mtodo post-ovulacin. Consiste en planificar las relaciones sexuales para despus de haber ovulado. Independientemente del tipo o mtodo anticonceptivo que usted elija, es importante que use condones para protegerse contra las enfermedades de transmisin sexual (ETS). Hable con su mdico con respecto a qu mtodo anticonceptivo es el ms apropiado para usted.  Document Released: 01/09/2005 Document Revised: 04/03/2011 Exit[employer] Patient Information 2013 ExitCare[employer].  

## 2012-01-15 NOTE — Progress Notes (Signed)
Diabetes Education:  Provided one box strips, ZOX:[id number] Exp: 2014/03/23.  Maggie Corianne Buccellato, RN, RD, CDE

## 2012-01-15 NOTE — Progress Notes (Signed)
P = 84    [place] growth done today

## 2012-01-18 ENCOUNTER — Encounter (HOSPITAL_COMMUNITY): Payer: Self-pay

## 2012-01-18 ENCOUNTER — Inpatient Hospital Stay (HOSPITAL_COMMUNITY)
Admission: RE | Admit: 2012-01-18 | Discharge: 2012-01-20 | DRG: 775 | Disposition: A | Discharge status: Home / Self Care | Payer: Medicaid Other | Source: Ambulatory Visit | Attending: Obstetrics & Gynecology | Admitting: Obstetrics & Gynecology

## 2012-01-18 VITALS — BP 100/67 | HR 80 | Temp 98.3°F | Resp 18 | Ht 62.5 in | Wt 170.0 lb

## 2012-01-18 DIAGNOSIS — IMO0001 Reserved for inherently not codable concepts without codable children: Secondary | ICD-10-CM

## 2012-01-18 DIAGNOSIS — O0990 Supervision of high risk pregnancy, unspecified, unspecified trimester: Secondary | ICD-10-CM

## 2012-01-18 DIAGNOSIS — O99814 Abnormal glucose complicating childbirth: Principal | ICD-10-CM | POA: Diagnosis present

## 2012-01-18 DIAGNOSIS — O24419 Gestational diabetes mellitus in pregnancy, unspecified control: Secondary | ICD-10-CM

## 2012-01-18 LAB — ABO/RH: ABO/RH(D): A POS

## 2012-01-18 LAB — TYPE AND SCREEN
ABO/RH(D): A POS
Antibody Screen: NEGATIVE

## 2012-01-18 LAB — CBC
MCH: 31.3 pg (ref 26.0–34.0)
MCHC: 33.8 g/dL (ref 30.0–36.0)
MCV: 92.5 fL (ref 78.0–100.0)
Platelets: 289 10*3/uL (ref 150–400)

## 2012-01-18 LAB — RPR: RPR Ser Ql: NONREACTIVE

## 2012-01-18 MED ORDER — SIMETHICONE 80 MG PO CHEW
80.0000 mg | CHEWABLE_TABLET | ORAL | Status: DC | PRN
  Administered 2012-01-19: 80 mg via ORAL

## 2012-01-18 MED ORDER — WITCH HAZEL-GLYCERIN EX PADS: 1.0000 "application " | MEDICATED_PAD | CUTANEOUS | Status: DC | PRN

## 2012-01-18 MED ORDER — IBUPROFEN 600 MG PO TABS: 600.0000 mg | ORAL_TABLET | Freq: Four times a day (QID) | ORAL | Status: DC | PRN

## 2012-01-18 MED ORDER — ONDANSETRON HCL 4 MG/2ML IJ SOLN: 4.0000 mg | INTRAMUSCULAR | Status: DC | PRN

## 2012-01-18 MED ORDER — LIDOCAINE HCL (PF) 1 % IJ SOLN: 30.0000 mL | INTRAMUSCULAR | Status: DC | PRN

## 2012-01-18 MED ORDER — DIPHENHYDRAMINE HCL 25 MG PO CAPS: 25.0000 mg | ORAL_CAPSULE | Freq: Four times a day (QID) | ORAL | Status: DC | PRN

## 2012-01-18 MED ORDER — OXYCODONE-ACETAMINOPHEN 5-325 MG PO TABS: 1.0000 | ORAL_TABLET | ORAL | Status: DC | PRN

## 2012-01-18 MED ORDER — OXYTOCIN 40 UNITS IN LACTATED RINGERS INFUSION - SIMPLE MED
62.5000 mL/h | INTRAVENOUS | Status: DC
  Administered 2012-01-18: 999 mL/h via INTRAVENOUS

## 2012-01-18 MED ORDER — ONDANSETRON HCL 4 MG/2ML IJ SOLN: 4.0000 mg | Freq: Four times a day (QID) | INTRAMUSCULAR | Status: DC | PRN

## 2012-01-18 MED ORDER — LACTATED RINGERS IV SOLN: 500.0000 mL | INTRAVENOUS | Status: DC | PRN

## 2012-01-18 MED ORDER — TETANUS-DIPHTH-ACELL PERTUSSIS 5-2.5-18.5 LF-MCG/0.5 IM SUSP
0.5000 mL | Freq: Once | INTRAMUSCULAR | Status: AC
  Administered 2012-01-19: 0.5 mL via INTRAMUSCULAR
  Filled 2012-01-18: qty 0.5

## 2012-01-18 MED ORDER — OXYTOCIN 40 UNITS IN LACTATED RINGERS INFUSION - SIMPLE MED
1.0000 m[IU]/min | INTRAVENOUS | Status: DC
  Administered 2012-01-18: 1 m[IU]/min via INTRAVENOUS
  Administered 2012-01-18: 2 m[IU]/min via INTRAVENOUS
  Filled 2012-01-18: qty 1000

## 2012-01-18 MED ORDER — FLEET ENEMA 7-19 GM/118ML RE ENEM: 1.0000 | ENEMA | RECTAL | Status: DC | PRN

## 2012-01-18 MED ORDER — LANOLIN HYDROUS EX OINT: TOPICAL_OINTMENT | CUTANEOUS | Status: DC | PRN

## 2012-01-18 MED ORDER — IBUPROFEN 600 MG PO TABS
600.0000 mg | ORAL_TABLET | Freq: Four times a day (QID) | ORAL | Status: DC
  Administered 2012-01-18 – 2012-01-20 (×6): 600 mg via ORAL
  Filled 2012-01-18 (×7): qty 1

## 2012-01-18 MED ORDER — DIBUCAINE 1 % RE OINT: 1.0000 "application " | TOPICAL_OINTMENT | RECTAL | Status: DC | PRN

## 2012-01-18 MED ORDER — BENZOCAINE-MENTHOL 20-0.5 % EX AERO: 1.0000 "application " | INHALATION_SPRAY | CUTANEOUS | Status: DC | PRN

## 2012-01-18 MED ORDER — SENNOSIDES-DOCUSATE SODIUM 8.6-50 MG PO TABS
2.0000 | ORAL_TABLET | Freq: Every day | ORAL | Status: DC
  Administered 2012-01-18 – 2012-01-19 (×2): 2 via ORAL

## 2012-01-18 MED ORDER — ZOLPIDEM TARTRATE 5 MG PO TABS: 5.0000 mg | ORAL_TABLET | Freq: Every evening | ORAL | Status: DC | PRN

## 2012-01-18 MED ORDER — LACTATED RINGERS IV SOLN: INTRAVENOUS | Status: DC

## 2012-01-18 MED ORDER — ACETAMINOPHEN 325 MG PO TABS: 650.0000 mg | ORAL_TABLET | ORAL | Status: DC | PRN

## 2012-01-18 MED ORDER — OXYTOCIN BOLUS FROM INFUSION: 500.0000 mL | INTRAVENOUS | Status: DC

## 2012-01-18 MED ORDER — ONDANSETRON HCL 4 MG PO TABS: 4.0000 mg | ORAL_TABLET | ORAL | Status: DC | PRN

## 2012-01-18 MED ORDER — CITRIC ACID-SODIUM CITRATE 334-500 MG/5ML PO SOLN: 30.0000 mL | ORAL | Status: DC | PRN

## 2012-01-18 MED ORDER — TERBUTALINE SULFATE 1 MG/ML IJ SOLN: 0.2500 mg | Freq: Once | INTRAMUSCULAR | Status: DC | PRN

## 2012-01-18 MED ORDER — PRENATAL MULTIVITAMIN CH
1.0000 | ORAL_TABLET | Freq: Every day | ORAL | Status: DC
  Administered 2012-01-18 – 2012-01-20 (×3): 1 via ORAL
  Filled 2012-01-18 (×3): qty 1

## 2012-01-18 NOTE — Progress Notes (Signed)
Pt does not want cuff back on at this time. Marland Kitchen..

## 2012-01-18 NOTE — Progress Notes (Signed)
Alexandra Ford is a 31 y.o. G2P1001 at [redacted]w[redacted]d by ultrasound admitted for induction of labor due to gestational diabetes.  Subjective: Breathing through contractions, Declines pain medicine  Objective: BP 109/53  Pulse 72  Temp 97.9 F (36.6 C) (Oral)  Resp 18  Ht 5' 2.5" (1.588 m)  Wt 170 lb (77.111 kg)  BMI 30.60 kg/m2  LMP 03/10/2011  Breastfeeding? Unknown      FHT:  FHR: 130 bpm, variability: moderate,  accelerations:  Present,  decelerations:  Present intermittent variable decels UC:   regular, every 3 minutes SVE:   Dilation: 6 Effacement (%): 90 Station: -2 Exam by:: Wynelle Bourgeois, CNM   Labs: Lab Results  Component Value Date   WBC 10.3 01/18/2012   HGB 12.1 01/18/2012   HCT 35.8* 01/18/2012   MCV 92.5 01/18/2012   PLT 289 01/18/2012    Assessment / Plan: Induction of labor due to gestational diabetes,  progressing well on pitocin  Labor: Progressing on Pitocin, will continue to increase then AROM  -- AROM performed, clear fluid Preeclampsia:   Fetal Wellbeing:  Category II Pain Control:  Labor support without medications I/D:  n/a Anticipated MOD:  NSVD  Alexandra Ford 01/18/2012, 11:59 AM

## 2012-01-18 NOTE — H&P (Signed)
Seen and agree with note. Anticipate SVD after UCs increase Wynelle Bourgeois CNM

## 2012-01-18 NOTE — H&P (Signed)
Alexandra Ford [name] is a 31 y.o. female presenting for induction due gestational diabetes. Maternal Medical History:  Reason for admission: Reason for admission: contractions.  Reason for Admission:   nauseaContractions: Onset was yesterday.   Frequency: regular.   Perceived severity is moderate.    Fetal activity: Perceived fetal activity is normal.   Last perceived fetal movement was within the past hour.    Prenatal complications: No bleeding or cholelithiasis.   Prenatal Complications - Diabetes: type 2. Diabetes is managed by oral agent (monotherapy).      OB History    Grav Para Term Preterm Abortions TAB SAB Ect Mult Living   2 1 1       1      Past Medical History  Diagnosis Date  . Gestational diabetes    History reviewed. No pertinent past surgical history. Family History: family history includes Asthma in her maternal grandmother; Cancer in her paternal aunt; Diabetes in her father and paternal grandmother; and Hypertension in her mother. Social History:  reports that she has never smoked. She has never used smokeless tobacco. She reports that she does not drink alcohol or use illicit drugs.   Prenatal Transfer Tool  Maternal Diabetes: Yes:  Diabetes Type:  Insulin/Medication controlled Genetic Screening: Normal Maternal Ultrasounds/Referrals: Normal Fetal Ultrasounds or other Referrals:  None Maternal Substance Abuse:  No Significant Maternal Medications:  None Significant Maternal Lab Results:  None Other Comments:  None  Review of Systems  Constitutional: Negative for fever.  Cardiovascular: Negative for chest pain.  Gastrointestinal: Negative for heartburn, nausea and vomiting.  Genitourinary: Negative for dysuria.  Neurological: Negative for headaches.    Dilation: 5 Effacement (%): 50 Station: -2 Exam by:: Guila Owensby, md/hk Blood pressure 108/66, pulse 71, temperature 98 F (36.7 C), temperature source Oral, resp. rate 18, height 5' 2.5" (1.588  m), weight 77.111 kg (170 lb), last menstrual period 03/10/2011, unknown if currently breastfeeding. Maternal Exam:  Uterine Assessment: Contraction strength is mild.  Contraction duration is 6 minutes. Contraction frequency is regular.   Abdomen: Fetal presentation: vertex  Introitus: Normal vulva. Normal vagina.  Ferning test: not done.  Nitrazine test: not done. Amniotic fluid character: not assessed.  Cervix: Cervix evaluated by digital exam.     Fetal Exam Fetal Monitor Review: Mode: hand-held doppler probe.   Baseline rate: 130.  Variability: moderate (6-25 bpm).   Pattern: accelerations present and no decelerations.    Fetal State Assessment: Category I - tracings are normal.     Physical Exam  Constitutional: She appears well-developed.  HENT:  Head: Normocephalic.  Cardiovascular: Normal rate and normal heart sounds.   Respiratory: Effort normal.    Prenatal labs: ABO, Rh: --/--/A POS (12/26 0745) Antibody: NEG (12/26 0745) Rubella: Immune (07/09 0000) RPR: NON REAC (11/18 0935)  HBsAg: Negative (07/09 0000)  HIV: Non-reactive (07/09 0000)  GBS: Negative (12/16 0000)   Assessment/Plan: Assessment: 1. Labor: active  2. Fetal Wellbeing: Category I  3. Pain Control: None, will consider Epidural 4. GBS: Negative  Plan:  1. Admit to BS 2. Routine L&D orders 3. Analgesia/anesthesia PRN       Governor Specking 01/18/2012, 9:42 AM

## 2012-01-19 NOTE — Progress Notes (Signed)
Post Partum Day 1 Subjective: no complaints, up ad lib, voiding, tolerating PO and + flatus  Objective: Blood pressure 96/60, pulse 80, temperature 97.8 F (36.6 C), temperature source Axillary, resp. rate 18, height 5' 2.5" (1.588 m), weight 77.111 kg (170 lb), last menstrual period 03/10/2011, unknown if currently breastfeeding.  Physical Exam:  General: alert and cooperative no distress Lochia: appropriate Uterine Fundus: firm DVT Evaluation: No evidence of DVT seen on physical exam.   Basename 01/18/12 0745  HGB 12.1  HCT 35.8*    Assessment/Plan: Plan for discharge tomorrow, Breastfeeding and Contraception Mirena   LOS: 1 day   Governor Specking 01/19/2012, 7:39 AM

## 2012-01-19 NOTE — Progress Notes (Signed)
UR chart review completed.  

## 2012-01-19 NOTE — Progress Notes (Signed)
Seen and agree Tecia Cinnamon CNM 

## 2012-01-19 NOTE — Progress Notes (Signed)
Patient was referred for history of depression/anxiety. * Referral screened out by Clinical Social Worker because none of the following criteria appear to apply:  ~ History of anxiety/depression during this pregnancy, or of post-partum depression.  ~ Diagnosis of anxiety and/or depression within last 3 years  ~ History of depression due to pregnancy loss/loss of child  OR * Patient's symptoms currently being treated with medication and/or therapy.  Please contact the Clinical Social Worker if needs arise, or by the patient's request. As per chart review, no history noted.

## 2012-01-20 MED ORDER — IBUPROFEN 600 MG PO TABS: 600.0000 mg | ORAL_TABLET | Freq: Four times a day (QID) | ORAL | Status: DC

## 2012-01-20 NOTE — Discharge Summary (Signed)
Obstetric Discharge Summary Reason for Admission: onset of labor but was sched for IOL on the same day for A2DM Prenatal Procedures: none Intrapartum Procedures: spontaneous vaginal delivery Postpartum Procedures: none Complications-Operative and Postpartum: none Hemoglobin  Date Value Range Status  01/18/2012 12.1  12.0 - 15.0 g/dL Final  4/0/9811 91.4   Final     HCT  Date Value Range Status  01/18/2012 35.8* 36.0 - 46.0 % Final  08/01/2011 34   Final    Physical Exam:  General: alert, cooperative and no distress Lochia: appropriate Uterine Fundus: firm DVT Evaluation: No evidence of DVT seen on physical exam.  Discharge Diagnoses: Term Pregnancy-delivered  Discharge Information: Date: 01/20/2012 Activity: pelvic rest Diet: routine Medications: PNV and Ibuprofen Condition: stable Instructions: refer to practice specific booklet Discharge to: home Follow-up Information    Follow up with [facility]. (Make an appointment for 4-6 weeks postpartum)    Contact information:   [address] [place] [address]          Newborn Data: Live born female  Birth Weight: 6 lb 7.8 oz (2943 g) APGAR: 8, 9  Home with mother. Breastfeeding; desires Mirena for contraception. Will have f/u DM testing at [facility] visit.  Cam Hai 01/20/2012, 7:40 AM

## 2012-01-22 ENCOUNTER — Telehealth (HOSPITAL_COMMUNITY): Payer: Self-pay | Admitting: *Deleted

## 2012-01-22 NOTE — Progress Notes (Signed)
NST 125 reactive

## 2012-01-22 NOTE — Telephone Encounter (Signed)
Resolve episode 

## 2012-01-22 NOTE — Progress Notes (Signed)
NST 12-12 reactive

## 2012-01-23 NOTE — Discharge Summary (Signed)
Agree with above note.  Alexandra Ford H. 01/23/2012 9:16 PM

## 2012-01-31 ENCOUNTER — Encounter: Payer: Self-pay | Admitting: *Deleted

## 2012-02-22 ENCOUNTER — Ambulatory Visit (INDEPENDENT_AMBULATORY_CARE_PROVIDER_SITE_OTHER): Payer: Self-pay | Admitting: Advanced Practice Midwife

## 2012-02-22 ENCOUNTER — Encounter: Payer: Self-pay | Admitting: Advanced Practice Midwife

## 2012-02-22 VITALS — BP 106/65 | HR 64 | Temp 98.3°F | Wt 147.6 lb

## 2012-02-22 DIAGNOSIS — E049 Nontoxic goiter, unspecified: Secondary | ICD-10-CM

## 2012-02-22 DIAGNOSIS — K5900 Constipation, unspecified: Secondary | ICD-10-CM

## 2012-02-22 DIAGNOSIS — E010 Iodine-deficiency related diffuse (endemic) goiter: Secondary | ICD-10-CM

## 2012-02-22 NOTE — Patient Instructions (Addendum)
Miralax  Constipacin - Adulto  (Constipation, Adult)  Constipacin significa que una persona tiene menos de 3 evacuaciones en una semana, hay dificultad para evacuar el intestino, o las heces son secas, duras, o ms grandes que lo normal. A medida que envejecemos el estreimiento es ms comn. Si intenta curar el estreimiento con medicamentos que producen la evacuacin intestinal (laxantes), el problema puede empeorar. El uso prolongado de laxantes puede hacer que los msculos del colon se debiliten. Una dieta baja en fibra, no tomar suficientes lquidos y el uso de ciertos medicamentos pueden Scientist, research (life sciences).  CAUSAS   Ciertos medicamentos, como los antidepresivos, analgsicos, suplementos de hierro, anticidos y diurticos.   Algunas enfermedades, como la diabetes, el sndrome del colon irritable (SII), enfermedad de la tiroides, o depresin.   No beber suficiente agua.   No consumir suficientes alimentos ricos en fibra.   Situaciones de estrs o viajes.  Falta de actividad fsica o de ejercicio.  No ir al bao cuando siente la necesidad.  Ignorar la necesidad sbita de Licensed conveyancer intestino.  Uso en exceso de laxantes. SNTOMAS   Evacuar el intestino menos de 3 veces a la semana.   Dificultad para mover el intestino   Tener las heces secas y duras, o ms grandes que las normales.   Sensacin de estar lleno o distendido.   Dolor en la parte baja del abdomen  No se siente alivio despus de evacuar el intestino. DIAGNSTICO  El mdico le har una historia clnica y le har un examen fsico. Pueden hacerle exmenes adicionales para el estreimiento grave. Algunas pruebas son:   Un radiografa con enema de bario para examinar el recto, el colon y en algunos casos el intestino delgado.  Una sigmoidoscopia para examinar el colon inferior.  Una colonoscopia para examinar todo el colon. TRATAMIENTO  El tratamiento depender de la gravedad de la constipacin y  de la causa. Algunos tratamientos dietticos son beber ms lquidos y comer ms alimentos ricos en fibra. El cambio en el estilo de vida incluye hacer ejercicios de [place] regular. Si estas recomendaciones para Public relations account executive dieta y en el estilo de vida no ayudan, el mdico le puede indicar el uso de laxantes de venta libre para Pharmacologist movimiento intestinal. Los medicamentos con Engineer, drilling se pueden prescribir si los medicamentos de venta libre no lo mejoran.  INSTRUCCIONES PARA EL CUIDADO EN EL HOGAR   Aumente el consumo de alimentos con [place], como frutas, verduras, granos enteros y frijoles. Limite los azcares ricos en grasas y procesados   en su dieta, tales como papas fritas, hamburguesas, galletas, dulces y refrescos.   Puede agregar un suplemento de fibra a su dieta si no obtiene lo suficiente de los alimentos.   Debe ingerir gran cantidad de lquido para mantener la orina de tono claro o color amarillo plido.   Haga ejercicios regularmente o segn las indicaciones de su mdico.   Vaya al bao cuando sienta la necesidad de ir. No espere.  Tome slo la medicacin que le indic el profesional.  No tome otros medicamentos para la constipacin sin Science writer a su mdico. SOLICITE ATENCIN MDICA DE INMEDIATO SI:   Observa sangre brillante en las heces.   La constipacin dura ms de 4 das o [address].   Siente dolor abdominal o rectal.   Las heces son delgadas como un lpiz.  Pierde peso de [place] inexplicable. ASEGRESE DE QUE:   Comprende estas instrucciones.  Controlar su enfermedad.  Solicitar ayuda de  inmediato si no mejora o empeora. Document Released: 01/29/2007 Document Revised: 07/11/2011 [facility] Patient Information 2013 [place], [place].\  Eleccin del mtodo anticonceptivo  (Contraception Choices) La anticoncepcin (control de la natalidad) es el uso de cualquier mtodo o dispositivo para Location manager. A continuacin se indican algunos de  esos mtodos.  MTODOS HORMONALES   Implante anticonceptivo. Es un tubo plstico delgado que contiene la hormona progesterona. No contiene estrgenos. El mdico inserta el tubo en la parte interna del brazo. El tubo puede Geneticist, molecular durante 3 aos. Despus de los 3 aos debe retirarse. El implante impide que los ovarios liberen vulos (ovulacin), espesa el moco cervical, lo que evita que los espermatozoides ingresen al tero y hace ms delgada la membrana que cubre el interior del tero.  Inyecciones de progesterona sola. Estas inyecciones se administran cada 3 meses para evitar el embarazo. La progesterona sinttica impide que los ovarios liberen vulos. Tambin hace que el moco cervical se espese y modifica el recubrimiento interno del tero. Esto hace ms difcil que los espermatozoides sobrevivan en el tero.  Pldoras anticonceptivas. Las pldoras anticonceptivas contienen estrgenos y Education officer, museum. Actan impidiendo que el vulo se forme en el ovario(ovulacin). Las pldoras anticonceptivas son recetadas por el mdico.Tambin se utilizan para tratar los perodos menstruales abundantes.  Minipldora. Este tipo de pldora anticonceptiva contiene slo hormona progesterona. Deben tomarse todos los [address]  [address] del mes y debe recetarlas el mdico.  Parches anticonceptivos. El parche contiene hormonas similares a las que contienen las pldoras anticonceptivas. Deben cambiarse una vez por semana y se utilizan bajo prescripcin mdica.  Anillo vaginal. Anillo vaginal contiene hormonas similares a las que contienen las pldoras anticonceptivas. Se deja colocado durante tres semanas, se lo retira durante 1 semana y luego se coloca uno nuevo. La paciente debe sentirse cmoda para insertar y retirar el anillo de la vagina.Es necesaria la receta del mdico.  Anticonceptivos de Associate Professor. Los anticonceptivos de emergencia son mtodos para evitar un embarazo despus de una relacin sexual sin  proteccin. Esta pldora puede tomarse inmediatamente despus de Child psychotherapist sexuales o hasta 5 [place] de haber tenido sexo sin proteccin. Es ms efectiva si se toma poco tiempo despus. Los anticonceptivos de emergencia estn disponibles sin prescripcin mdica. Consltelo con su farmacutico. No use los anticonceptivos de emergencia como nico mtodo anticonceptivo. MTODOS DE BARRERA   Condn masculino. Es una vaina delgada (ltex o goma) que se [place] en el pene durante el acto sexual. Deri Fuelling con espermicida para aumentar la efectividad.  Condn femenino. Es una vaina blanda y floja que se adapta suavemente a la vagina antes de las relaciones sexuales.  Diafragma. Es una barrera de ltex redonda y [place] Bilis que debe ser ajustada por un profesional. Se inserta en la vagina, junto con un gel espermicida. Debe insertarse antes de Management consultant. Debe dejar el diafragma colocado en la vagina durante 6 a 8 horas despus de la relacin sexual.  Capuchn cervical. Es una taza de ltex o plstico, redonda y [place] que cubre el cuello del tero y debe ser ajustada por un mdico. Puede dejarlo colocado en la vagina hasta 48 horas despus de las Clinical research associate.  Esponja. Es una pieza blanda y circular de espuma de poliuretano. Contiene un espermicida. Se inserta en la vagina despus de mojarla y antes de las [employer].  Espermicidas. Los espermicidas son qumicos que matan o bloquean el esperma y no lo dejan ingresar al cuello del tero y al tero. Vienen en forma de  cremas, geles, supositorios, espuma o comprimidos. No es necesario tener Emergency planning/management officer. Se insertan en la vagina con un aplicador antes de Management consultant. El proceso debe repetirse cada vez que tiene relaciones sexuales. ANTICONCEPTIVOS INTRAUTERINOS   Dispositivo intrauterino (DIU). Es un dispositivo en forma de T que se coloca en el tero durante el perodo menstrual, para Location manager. Hay dos  tipos:  DIU de cobre. Este tipo de DIU est recubierto con un alambre de cobre y se inserta dentro del tero. El cobre hace que el tero y las trompas de Falopio produzcan un liquido que [employer] espermatozoides. Puede permanecer colocado durante 10 aos.  DIU hormonal. Este tipo de DIU contiene la hormona progestina (progesterona sinttica). La hormona espesa el moco cervical y evita que los espermatozoides ingresen al tero y tambin afina la membrana que cubre el tero para evitar la implantacin del vulo fertilizado. La hormona debilita o destruye los espermatozoides que ingresan al tero. Puede permanecer colocado durante 5 aos. MTODOS ANTICONCEPTIVOS PERMANENTES   Ligadura de trompas en la mujer. La ligadura de trompas en la mujer se realiza sellando, atando u obstruyendo quirrgicamente las trompas de [place]op[place] lo que impide que el vulo descienda hacia el tero.  Esterilizacin masculina. Se realiza atando los conductos por los que pasan los espermatozoides (vasectoma).Esto impide que el esperma ingrese a la vagina durante el acto sexual. Luego del procedimiento, el hombre puede eyacular lquido (semen). MTODOS DE PLANIFICACIN NATURAL   Planificacin familiar natural.  Consiste en no tener relaciones sexuales o usar un mtodo de barrera (condn, [place], capuchn cervical) en los [employer] la mujer podra quedar [place].  Mtodo calendario.  Consiste en el seguimiento de la duracin de cada ciclo menstrual y la identificacin de los perodos frtiles.  Mtodo de Occupational hygienist.  Consiste en evitar las relaciones sexuales durante la ovulacin.  Mtodo sintotrmico. Paramedic las relaciones sexuales en la poca en la que se est ovulando, utilizando un termmetro y tendiendo en cuenta los sntomas de la ovulacin.  Mtodo post-ovulacin. Consiste en planificar las relaciones sexuales para despus de haber ovulado. Independientemente del tipo o mtodo anticonceptivo que  usted elija, es importante que use condones para protegerse contra las enfermedades de transmisin sexual (ETS). Hable con su mdico con respecto a qu mtodo anticonceptivo es el ms apropiado para usted.  Document Released: 01/09/2005 Document Revised: 04/03/2011 [facility] Patient Information 2013 [place], [place].

## 2012-02-22 NOTE — Progress Notes (Signed)
  Subjective:     Alexandra Ford is a 32 y.o. female who presents for a postpartum visit. She is 5 weeks postpartum following a spontaneous vaginal delivery. I have fully reviewed the prenatal and intrapartum course. The delivery was at 39 gestational weeks. Outcome: spontaneous vaginal delivery. Anesthesia: none. Postpartum course has been normal except for LLQ pain x 1 week, constipation and mild vaginal pain since delivery. No lacerations or epis.  Baby's course has been normal. Baby is feeding by breast. Bleeding staining only. Bowel function is abnormal: Constipation. Hard, difficult BM's Q 2 days. Bleeding hemorrhoids. . Bladder function is normal. Patient is not sexually active. Contraception method is abstinence. Postpartum depression screening: negative.  The following portions of the patient's history were reviewed and updated as appropriate: allergies, current medications, past family history, past medical history, past social history, past surgical history and problem list.  Review of Systems Denies fever, chills, urinary complaints, stenuous activity.  There are no aggravating or aleviating factors.   Objective:    BP 106/65  Pulse 64  Temp 98.3 F (36.8 C)  Wt 147 lb 9.6 oz (66.951 kg)  Breastfeeding? Yes  General:  alert, cooperative and no distress   Breasts:  Deferred  Lungs: clear to auscultation bilaterally  Heart:  regular rate and rhythm, S1, S2 normal, no murmur, click, rub or gallop  Abdomen: normal findings: no masses palpable and soft. Mild LLQ tenderness.   Vulva:  normal 5 cm soft, raised, NT, skin-colored mass on right inner thigh. (state she has had this for "a long time".)  Vagina: normal vagina, no discharge, exudate, lesion, or erythema and scant tan bleeding  Cervix:  multiparous appearance, no cervical motion tenderness and no lesions  Corpus: normal size, contour, position, consistency, mobility, non-tender  Adnexa:  normal adnexa  Rectal  Exam: External hemorrhoids not visable.        Assessment:    Normal postpartum exam except for constipation. Normal vulvar tenderness postpartum. Pap smear not done at today's visit.  1. Thyromegaly  TSH, Cervicovaginal ancillary only-GC/CT  2. Postpartum care and examination    3. Constipation  Miralax    Plan:    1. Contraception: considering Mirena. Scholarship applied for. Info given.  2. Miralax and dietary changes for constipation. 3. Follow up in: 2 weeks for 2 hour GTT and Mirena if desired or as needed.  4. See PCP for mass on thigh  [place], CNM 02/22/2012 5:34 PM

## 2012-03-07 ENCOUNTER — Other Ambulatory Visit: Payer: Self-pay

## 2012-03-12 ENCOUNTER — Other Ambulatory Visit: Payer: Self-pay

## 2012-03-14 ENCOUNTER — Other Ambulatory Visit: Payer: Self-pay

## 2012-03-15 LAB — GLUCOSE TOLERANCE, 2 HOURS W/ 1HR
Glucose, 1 hour: 137 mg/dL (ref 70–170)
Glucose, 2 hour: 78 mg/dL (ref 70–139)
Glucose, Fasting: 89 mg/dL (ref 70–99)

## 2012-03-18 ENCOUNTER — Ambulatory Visit: Payer: Self-pay | Admitting: Advanced Practice Midwife

## 2013-11-24 ENCOUNTER — Encounter: Payer: Self-pay | Admitting: Advanced Practice Midwife

## 2017-07-18 ENCOUNTER — Other Ambulatory Visit (HOSPITAL_COMMUNITY): Payer: Self-pay | Admitting: *Deleted

## 2017-07-18 ENCOUNTER — Encounter (HOSPITAL_COMMUNITY): Payer: Self-pay | Admitting: *Deleted

## 2017-07-18 DIAGNOSIS — N644 Mastodynia: Secondary | ICD-10-CM

## 2017-07-18 DIAGNOSIS — N6459 Other signs and symptoms in breast: Secondary | ICD-10-CM

## 2017-08-16 ENCOUNTER — Ambulatory Visit: Payer: Self-pay

## 2017-08-16 ENCOUNTER — Other Ambulatory Visit: Payer: Self-pay | Admitting: Obstetrics and Gynecology

## 2017-08-16 ENCOUNTER — Ambulatory Visit
Admission: RE | Admit: 2017-08-16 | Discharge: 2017-08-16 | Disposition: A | Discharge status: Home / Self Care | Payer: No Typology Code available for payment source | Source: Ambulatory Visit | Attending: Obstetrics and Gynecology | Admitting: Obstetrics and Gynecology

## 2017-08-16 ENCOUNTER — Encounter (HOSPITAL_COMMUNITY): Payer: Self-pay

## 2017-08-16 ENCOUNTER — Ambulatory Visit (HOSPITAL_COMMUNITY)
Admission: RE | Admit: 2017-08-16 | Discharge: 2017-08-16 | Disposition: A | Discharge status: Home / Self Care | Payer: Self-pay | Source: Ambulatory Visit | Attending: Obstetrics and Gynecology | Admitting: Obstetrics and Gynecology

## 2017-08-16 VITALS — BP 104/68

## 2017-08-16 DIAGNOSIS — Z1239 Encounter for other screening for malignant neoplasm of breast: Secondary | ICD-10-CM

## 2017-08-16 DIAGNOSIS — R921 Mammographic calcification found on diagnostic imaging of breast: Secondary | ICD-10-CM

## 2017-08-16 DIAGNOSIS — N644 Mastodynia: Secondary | ICD-10-CM

## 2017-08-16 NOTE — Progress Notes (Signed)
Complaints of bilateral breast pain x 2-3 months that is greater within the left breast. Patient rates the pain at a 7 out of 10 within the left breast and 5 out of 10 within the right breast.   Pap Smear: Pap smear not completed today. [facility] and normal. Per patient has no history of an abnormal Pap smear. Last Pap smear result is in Epic.  Physical exam: Breasts Breasts symmetrical. No skin abnormalities bilateral breasts. No nipple retraction right breast. Left nipple slightly retracted that per patient is less than it was prior to breastfeeding. No nipple discharge bilateral breasts. No lymphadenopathy. No lumps palpated bilateral breasts. Complaints of left lower breast and right nipple pain on exam. [facility]. Appointment scheduled for Thursday, August 16, 2017 at 1040.        Pelvic/Bimanual No Pap smear completed today since last Pap smear was 04/13/2017. Pap smear not indicated per BCCCP guidelines.   Smoking History: Patient has never smoked.  Patient Navigation: Patient education provided. [facility]. Spanish interpreter provided.   Breast and Cervical Cancer Risk Assessment: Patient has a family history of a paternal aunt having breast cancer. Patient has no known genetic mutations or history of radiation treatment to the chest before age 37. Patient has no history of cervical dysplasia, immunocompromised, or DES exposure in-utero.  Risk Assessment    Risk Scores      08/16/2017   Last edited by: Lynnell DikeHolland, Sabrina H, LPN   5-year risk: 0.3 %   Lifetime risk: 8 %         Used Spanish interpreter Natale LayErika McReynolds from [facility].

## 2017-08-16 NOTE — Patient Instructions (Signed)
Explained breast self awareness with [name]. PatienRoxy Ford did not need a Pap smear today due to last Pap smear was 04/13/2017. Let her know BCCCP will cover Pap smears every 3 years unless has a history of abnormal Pap smears. [facility]. Appointment scheduled for Thursday, August 16, 2017 at 1040. [name] verbalized understanding.  Shaquon Gropp, Kathaleen Maserhristine Poll, RN 10:02 AM

## 2017-08-17 ENCOUNTER — Encounter (HOSPITAL_COMMUNITY): Payer: Self-pay | Admitting: *Deleted

## 2018-02-21 ENCOUNTER — Other Ambulatory Visit: Payer: Self-pay | Admitting: Obstetrics and Gynecology

## 2018-02-21 ENCOUNTER — Ambulatory Visit
Admission: RE | Admit: 2018-02-21 | Discharge: 2018-02-21 | Disposition: A | Discharge status: Home / Self Care | Payer: No Typology Code available for payment source | Source: Ambulatory Visit | Attending: Obstetrics and Gynecology | Admitting: Obstetrics and Gynecology

## 2018-02-21 DIAGNOSIS — R921 Mammographic calcification found on diagnostic imaging of breast: Secondary | ICD-10-CM

## 2018-03-19 ENCOUNTER — Encounter: Payer: Self-pay | Admitting: Gastroenterology

## 2018-04-15 ENCOUNTER — Other Ambulatory Visit (HOSPITAL_COMMUNITY): Payer: Self-pay | Admitting: Obstetrics and Gynecology

## 2018-04-15 ENCOUNTER — Other Ambulatory Visit (HOSPITAL_COMMUNITY): Payer: Self-pay | Admitting: *Deleted

## 2018-04-15 DIAGNOSIS — Z1231 Encounter for screening mammogram for malignant neoplasm of breast: Secondary | ICD-10-CM

## 2018-04-15 DIAGNOSIS — R921 Mammographic calcification found on diagnostic imaging of breast: Secondary | ICD-10-CM

## 2018-04-25 ENCOUNTER — Other Ambulatory Visit: Payer: Self-pay

## 2018-04-25 ENCOUNTER — Ambulatory Visit: Payer: Self-pay | Admitting: Gastroenterology

## 2018-08-20 ENCOUNTER — Ambulatory Visit (HOSPITAL_COMMUNITY): Payer: No Typology Code available for payment source

## 2018-09-10 ENCOUNTER — Ambulatory Visit (HOSPITAL_COMMUNITY): Payer: No Typology Code available for payment source

## 2019-04-03 ENCOUNTER — Other Ambulatory Visit: Payer: Self-pay

## 2019-04-03 ENCOUNTER — Ambulatory Visit: Payer: Self-pay | Admitting: Neurology

## 2019-04-03 ENCOUNTER — Telehealth: Payer: Self-pay | Admitting: *Deleted

## 2019-04-03 ENCOUNTER — Encounter: Payer: Self-pay | Admitting: Neurology

## 2019-04-03 VITALS — BP 129/82 | HR 71 | Temp 97.3°F | Ht 63.5 in | Wt 200.0 lb

## 2019-04-03 DIAGNOSIS — G44019 Episodic cluster headache, not intractable: Secondary | ICD-10-CM

## 2019-04-03 DIAGNOSIS — G43709 Chronic migraine without aura, not intractable, without status migrainosus: Secondary | ICD-10-CM

## 2019-04-03 MED ORDER — RIZATRIPTAN BENZOATE 10 MG PO TBDP: 10.0000 mg | ORAL_TABLET | ORAL | 11 refills | Status: DC | PRN

## 2019-04-03 MED ORDER — ONDANSETRON 4 MG PO TBDP: 4.0000 mg | ORAL_TABLET | Freq: Three times a day (TID) | ORAL | 3 refills | Status: DC | PRN

## 2019-04-03 MED ORDER — TOPIRAMATE 50 MG PO TABS: 50.0000 mg | ORAL_TABLET | Freq: Two times a day (BID) | ORAL | 3 refills | Status: DC

## 2019-04-03 NOTE — Progress Notes (Signed)
[facility]    Provider:  Dr Lucia Gaskins Requesting Provider: Willaim Rayas, NP Primary Care Provider:  Willodean Rosenthal, MD  CC:  Migraines  HPI:  Alexandra Ford is a 39 y.o. female here as requested by Willaim Rayas, NP for headache.  I reviewed notes from Dr. Elmer Sow, patient was seen February 19, 2019, complaining of headache for 5 days, pain in the right brow, right nostril, drainage, no change in pain with medication, she was given Toradol Kenalog injection, azithromycin, diagnosed with sinusitis and migraines.  I reviewed lab tests CBC normal, hemoglobin A1c 6.1, CMP was unremarkable except for slightly elevated glucose, elevated BUN/creatinine indicating likely dehydration, BUN 21, creatinine 0.53, tsh normal all labs were taken March 14, 2018.   She has had migraines for many years, since she was 20. She has been suffering for many years. It starts on one side of the head, throbbing, severe pain, pressure, it makes her sleepy, nausea, anxiety, moving makes it worse, she has vomited in the past, certain foods can trigger, she also has light sensitivity. They can last 24 hours. She has headaches often, she can wake up with headaches and can be worse positionally. She has 15-20 migraine days a month that are moderately severe to severe. She has vision changes with the headaches. She has never really seen a doctor for this or had brain imaging. She had a pain that she never had before on the left side it was above her eyebrow, intense through the eye, severe, intense, and the eye started tearing just on that side.Sumatriptan doesn't help. She takes tylenol sometimes, no medication overuse, no aura. Here with interpreter. She has an IUD,no children are planned.    Review of Systems: Patient complains of symptoms per HPI as well as the following symptoms: headache. Pertinent negatives and positives per HPI. All others negative.   Social History    Socioeconomic History  . Marital status: Married    Spouse name: Not on file  . Number of children: 2  . Years of education: Not on file  . Highest education level: Not on file  Occupational History  . Occupation: cleans houses  Tobacco Use  . Smoking status: Never Smoker  . Smokeless tobacco: Never Used  Substance and Sexual Activity  . Alcohol use: No  . Drug use: No  . Sexual activity: Yes    Birth control/protection: I.U.D.  Other Topics Concern  . Not on file  Social History Narrative   Lives with husband & children   Right handed   Caffeine: coffee (espresso) 1 cup/day. Sometimes she takes 2 pepsis but not daily   Social Determinants of Health   Financial Resource Strain:   . Difficulty of Paying Living Expenses:   Food Insecurity:   . Worried About Programme researcher, broadcasting/film/video in the Last Year:   . Barista in the Last Year:   Transportation Needs:   . Freight forwarder (Medical):   Marland Kitchen Lack of Transportation (Non-Medical):   Physical Activity:   . Days of Exercise per Week:   . Minutes of Exercise per Session:   Stress:   . Feeling of Stress :   Social Connections:   . Frequency of Communication with Friends and Family:   . Frequency of Social Gatherings with Friends and Family:   . Attends Religious Services:   . Active Member of Clubs or Organizations:   . Attends Banker Meetings:   .  Marital Status:   Intimate Partner Violence:   . Fear of Current or Ex-Partner:   . Emotionally Abused:   Marland Kitchen Physically Abused:   . Sexually Abused:     Family History  Problem Relation Age of Onset  . Hypertension Mother   . Diabetes Father   . Asthma Maternal Grandmother   . Diabetes Paternal Grandmother   . Cancer Paternal Aunt        breast  . Breast cancer Maternal Aunt   . Migraines Neg Hx     Past Medical History:  Diagnosis Date  . Gestational diabetes   . Migraines     Patient Active Problem List   Diagnosis Date Noted  .  Chronic migraine without aura without status migrainosus, not intractable 04/03/2019  . Episodic cluster headache, not intractable 04/03/2019    Past Surgical History:  Procedure Laterality Date  . NO PAST SURGERIES      Current Outpatient Medications  Medication Sig Dispense Refill  . Acetaminophen (TYLENOL PO) Take by mouth as needed.    . Ascorbic Acid (VITAMIN C PO) Take by mouth.    . Probiotic Product (PROBIOTIC PO) Take by mouth.    Marland Kitchen UNABLE TO FIND Med Name: iodine drops to water    . VITAMIN D PO Take by mouth.    . ondansetron (ZOFRAN-ODT) 4 MG disintegrating tablet Take 1 tablet (4 mg total) by mouth every 8 (eight) hours as needed for nausea. 20 tablet 3  . rizatriptan (MAXALT-MLT) 10 MG disintegrating tablet Take 1 tablet (10 mg total) by mouth as needed for migraine. May repeat in 2 hours if needed 9 tablet 11  . topiramate (TOPAMAX) 50 MG tablet Take 1 tablet (50 mg total) by mouth 2 (two) times daily. 60 tablet 3   No current facility-administered medications for this visit.    Allergies as of 04/03/2019 - Review Complete 04/03/2019  Allergen Reaction Noted  . Penicillins Itching 04/03/2019    Vitals: BP 129/82 (BP Location: Right Arm, Patient Position: Sitting)   Pulse 71   Temp (!) 97.3 F (36.3 C) Comment: taken at front  Ht 5' 3.5" (1.613 m)   Wt 200 lb (90.7 kg)   BMI 34.87 kg/m  Last Weight:  Wt Readings from Last 1 Encounters:  04/03/19 200 lb (90.7 kg)   Last Height:   Ht Readings from Last 1 Encounters:  04/03/19 5' 3.5" (1.613 m)     Physical exam: Exam: Gen: NAD, conversant, well nourised, obese, well groomed                     CV: RRR, no MRG. No Carotid Bruits. No peripheral edema, warm, nontender Eyes: Conjunctivae clear without exudates or hemorrhage  Neuro: Detailed Neurologic Exam  Speech:    Speech is normal; fluent and spontaneous with normal comprehension.  Cognition:    The patient is oriented to person, place, and time;      recent and remote memory intact;     language fluent;     normal attention, concentration,     fund of knowledge Cranial Nerves:    The pupils are equal, round, and reactive to light. The fundi are normal and spontaneous venous pulsations are present. Visual fields are full to finger confrontation. Extraocular movements are intact. Trigeminal sensation is intact and the muscles of mastication are normal. The face is symmetric. The palate elevates in the midline. Hearing intact. Voice is normal. Shoulder shrug is normal. The tongue  has normal motion without fasciculations.   Coordination:    Normal finger to nose   Gait:  normal native gait  Motor Observation:    No asymmetry, no atrophy, and no involuntary movements noted. Tone:    Normal muscle tone.    Posture:    Posture is normal. normal erect    Strength:    Strength is V/V in the upper and lower limbs.      Sensation: intact to LT     Reflex Exam:  DTR's:    Deep tendon reflexes in the upper and lower extremities are normal bilaterally.   Toes:    The toes are downgoing bilaterally.   Clonus:    Clonus is absent.    Assessment/Plan:  39 year old with chronic migraines and appears she had one episode of a cluster headache with severe orbital pain and autonomic symptoms.   MRI of the brain w/wo contrast, we gave her information on [facility]: Call us if you get [facility] Financial Assistance and we will order MRI of the brain. Patient is uninsured, states she cannot afford.  Start Topiramate for prevention: Start Topiramate I tab in the evening. In one week take 1 tab twice daily. If it makes you sleepy, you can take both at night. Maxalt and Zofran for acute  Discussed: To prevent or relieve headaches, try the following: Cool Compress. Lie down and place a cool compress on your head.  Avoid headache triggers. If certain foods or odors seem to have triggered your migraines in the past, avoid them. A  headache diary might help you identify triggers.  Include physical activity in your daily routine. Try a daily walk or other moderate aerobic exercise.  Manage stress. Find healthy ways to cope with the stressors, such as delegating tasks on your to-do list.  Practice relaxation techniques. Try deep breathing, yoga, massage and visualization.  Eat regularly. Eating regularly scheduled meals and maintaining a healthy diet might help prevent headaches. Also, drink plenty of fluids.  Follow a regular sleep schedule. Sleep deprivation might contribute to headaches Consider biofeedback. With this mind-body technique, you learn to control certain bodily functions -- such as muscle tension, heart rate and blood pressure -- to prevent headaches or reduce headache pain.    Proceed to emergency room if you experience new or worsening symptoms or symptoms do not resolve, if you have new neurologic symptoms or if headache is severe, or for any concerning symptom.   No orders of the defined types were placed in this encounter.  Meds ordered this encounter  Medications  . topiramate (TOPAMAX) 50 MG tablet    Sig: Take 1 tablet (50 mg total) by mouth 2 (two) times daily.    Dispense:  60 tablet    Refill:  3  . rizatriptan (MAXALT-MLT) 10 MG disintegrating tablet    Sig: Take 1 tablet (10 mg total) by mouth as needed for migraine. May repeat in 2 hours if needed    Dispense:  9 tablet    Refill:  11  . ondansetron (ZOFRAN-ODT) 4 MG disintegrating tablet    Sig: Take 1 tablet (4 mg total) by mouth every 8 (eight) hours as needed for nausea.    Dispense:  20 tablet    Refill:  3    Cc: Willaim Rayas, NP,  Willodean Rosenthal, MD  Naomie Dean, MD  [facility] [address] [place], [place] [address]  Phone [phone] Fax [phone]

## 2019-04-03 NOTE — Patient Instructions (Addendum)
Start Topiramate I tab in the evening. In one week take 1 tab twice daily. If it makes you sleepy, you can take both at night. At onset of headache take: Rizatriptan For nausea: Take ondansetron Call us if you get [facility] Financial Assistance and we will order MRI of the brain    Cefalea en brotes Cluster Headache La cefalea en brotes es un tipo de cefalea que produce un dolor de cabeza profundo e intenso. Las cefaleas en brotes pueden durar entre y 3horas. Suelen producirse de la siguiente manera:  De un lado de la cabeza. Pueden aparecer del otro lado cuando comienza una nueva cefalea en brotes.  Reiteradamente a lo largo de semanas o meses.  Varias veces por da.  En el mismo momento del da, generalmente, por la noche.  Con mayor frecuencia en otoo y en primavera. Cules son las causas? Se desconoce la causa de esta afeccin. Qu incrementa el riesgo? Es ms probable que esta afeccin se manifieste en:  Hombres.  Personas que beben alcohol.  Personas que fuman o que consumen productos que contienen nicotina o tabaco.  Personas que toman medicamentos que hacen que los vasos sanguneos se expandan, como la nitroglicerina.  Personas que toman antihistamnicos. Cules son los signos o los sntomas? Los sntomas de esta afeccin [employer] siguientes:  Dolor intenso de un lado de la cabeza que comienza detrs o alrededor del ojo o en la sien.  Dolor de un lado de la cabeza.  Nuseas.  Sensibilidad a Statistician.  Secrecin nasal y congestin nasal.  Piel del rostro plida y sudorosa.  Prpado cado o hinchado, enrojecimiento del ojo o lagrimeo.  Inquietud y Designer, jewellery. Cmo se diagnostica? Esta afeccin se puede diagnosticar en funcin de lo siguiente:  Sus sntomas.  Un examen fsico. Es posible que el mdico indique pruebas para ver si la causa de los dolores de cabeza es otra enfermedad. Estas pruebas podran indicar que usted no tiene cefaleas en  brotes. Los estudios pueden incluir lo siguiente:  Una exploracin por tomografa computarizada (TC) de la cabeza.  Una resonancia magntica (RM) de la cabeza.  Anlisis de laboratorio. Cmo se trata? El tratamiento de esta afeccin puede incluir lo siguiente:  Medicamentos para Engineer, materials y para prevenir los ataques repetidos (recurrentes). Algunas personas necesitarn una combinacin de medicamentos.  Oxgeno. Esto ayuda a Engineer, materials. Siga estas indicaciones en su casa: Registro diario de cefaleas Lleve un registro diario de Sales executive se lo haya indicado su mdico. Esto puede ayudarlos a usted y al mdico a Chief Strategy Officer qu factores desencadenan las cefaleas. En el registro diario de cefaleas, incluya informacin sobre lo siguiente:  La hora del da en que comenz la cefalea y qu estaba haciendo usted en ese momento.  Cunto tiempo dur la cefalea.  Dnde comenz el dolor y si se traslad a otras zonas.  El tipo de [place], por [place], [place], punzante, pulstil o de tipo calambre.  El grado de [place]. Use una escala de dolor y califique el dolor con un nmero del 1(leve) al 10(intenso).  El tratamiento que [place] y cualquier cambio en los sntomas tras [place].  Medicamentos  [employer] de venta libre y los recetados solamente como se lo haya indicado el mdico.  No conduzca ni use maquinaria pesada mientras toma analgsicos recetados.  Use el oxgeno como se lo haya indicado el mdico. Estilo de vida  Siga un patrn de sueo regular. No cambie la hora a  la que se acuesta ni vare la cantidad de tiempo que duerme de un da al otro. Es importante que siga el mismo patrn durante el perodo de brotes para ayudar a Cabin crew.  Haga ejercicios regularmente.  Siga una dieta saludable y evite los alimentos que puedan desencadenar las cefaleas.  Evite el alcohol.  No consuma ningn producto que contenga nicotina o tabaco, como  cigarrillos y Administrator, Civil Service. Si necesita ayuda para dejar de fumar, consulte al mdico. Comunquese con un mdico si:  Las cefaleas [place], se tornan ms intensas o se producen con mayor frecuencia.  El medicamento o el oxgeno que el mdico le recomend no lo [place]. Solicite ayuda de inmediato si:  Se desmaya.  Siente debilidad o adormecimiento, especialmente en un lado del cuerpo o el rostro.  Tiene visin doble.  Tiene nuseas o vmitos que no desaparecen en el transcurso de varias horas.  Tiene dificultad para hablar, caminar o mantener el equilibrio.  Tiene dolor o rigidez en el cuello.  Tiene fiebre. Resumen  La cefalea en brotes es un tipo de cefalea que produce un dolor de cabeza profundo e intenso, por lo general, de un lado de la cabeza.  Lleve un registro diario de cefaleas para ayudar a determinar cules son los factores que las desencadenan.  Un patrn de sueo regular puede ayudar a prevenir las cefaleas. Esta informacin no tiene Theme park manager el consejo del mdico. Asegrese de hacerle al mdico cualquier pregunta que tenga. Document Revised: 08/18/2016 Document Reviewed: 07/06/2015 Elsevier Patient Education  2020 Elsevier Inc.  Cefalea migraosa Migraine Headache Una cefalea migraosa es un dolor intenso y punzante en uno o ambos lados de la cabeza. Las cefaleas migraosas tambin pueden causar otros sntomas, como nuseas, vmitos y sensibilidad a la luz y el ruido. Una cefalea migraosa puede durar desde 4horas hasta 3das. Hable con su mdico [employer] factores que pueden causar Animal nutritionist) las Soil scientist. Cules son las causas? Se desconoce la causa exacta de esta afeccin. Sin embargo, una migraa puede aparecer [employer] nervios del cerebro se irritan y liberan ciertas sustancias qumicas que causan inflamacin de los vasos sanguneos. Esta inflamacin provoca dolor. Esta afeccin puede desencadenarse o ser causada por lo  siguiente:  Consumo de alcohol.  Consumo de cigarrillos.  Tomar medicamentos como por ejemplo: ? Medicamentos para Engineer, materials torcico (nitroglicerina). ? Anticonceptivos orales. ? Estrgeno. ? Ciertos medicamentos para la presin arterial.  Comer o beber productos que contienen nitratos, glutamato, aspartamo o tiramina. Los quesos [place], el chocolate o la cafena tambin pueden ser desencadenantes.  Hacer actividad fsica. Otros factores que pueden provocar cefalea migraosa son los siguientes:  Menstruacin.  Embarazo.  Hambre.  Estrs.  Dormir poco o Engineer, site.  Cambios climticos.  Fatiga. Qu incrementa el riesgo? Los siguientes factores pueden hacer que usted sea ms propenso a Warehouse manager migraas:  Psychologist, sport and exercise edad. Es ms probable que Copy se manifieste en personas que tienen entre 25 y 34 aos.  Ser mujer.  Tener antecedentes familiares de cefalea migraosa.  Ser de Engineer, manufacturing.  Tener una afeccin de salud mental, como depresin o ansiedad.  Ser obeso. Cules son los signos o los sntomas? El principal sntoma de esta afeccin es el dolor intenso y punzante. Este dolor puede [employer] siguientes caractersticas:  Puede aparecer en cualquier regin de la cabeza, tanto de un lado como de [place].  Puede interferir con las actividades de la vida cotidiana.  Puede empeorar con la  actividad fsica.  Puede empeorar ante la exposicin a luces brillantes o a ruidos fuertes. Otros sntomas pueden incluir:  Nuseas.  Vmitos.  Mareos.  Sensibilidad general a las luces brillantes, a los ruidos fuertes o a los [employer]. Antes de tener una cefalea migraosa, puede recibir seales de advertencia (aura). Un aura puede incluir:  Ver luces intermitentes o tener puntos ciegos.  Ver puntos brillantes, halos o lneas en zigzag.  Tener una visin en tnel o visin borrosa.  Sentir entumecimiento u hormigueo.  Tener dificultad para  hablar.  Debilidad muscular. Algunas personas tienen sntomas despus de una cefalea migraosa (fase posdromal), como los siguientes:  Cansancio.  Dificultad para concentrarte. Cmo se diagnostica? La cefalea migraosa se diagnostica en funcin de lo siguiente:  Sus sntomas.  Un examen fsico.  Pruebas, como, por ejemplo: ? Tomografa computarizada (TC) o resonancia magntica (RM) de la cabeza. Estos estudios de diagnstico por imgenes pueden ayudar a Teacher, early years/pre causas de cefalea. ? Tomar una muestra de lquido de la mdula espinal (puncin lumbar) para Chiropractor (anlisis de lquido cefalorraqudeo o anlisis de LCR). Cmo se trata? Esta afeccin se puede tratar con medicamentos para:  Engineer, materials.  Aliviar las nuseas.  Prevenir las Soil scientist. El tratamiento de esta afeccin tambin puede incluir lo siguiente:  Acupuntura.  Cambios en el estilo de vida, como evitar los alimentos que provocan las cefaleas migraosas.  Biorretroalimentacin.  Terapia cognitiva conductual. Siga estas instrucciones en su casa: Medicamentos  Use los medicamentos de venta libre y los recetados solamente como se lo haya indicado el mdico.  Pregntele al mdico si el medicamento recetado: ? Hace que sea necesario que evite conducir o usar maquinaria pesada. ? Puede causarle estreimiento. Es posible que tenga que tomar estas medidas para prevenir o tratar el estreimiento:  Product manager suficiente lquido como para Pharmacologist la orina de color amarillo plido.  Tomar medicamentos recetados o de [employer].  Consumir alimentos ricos en fibra, como frijoles, cereales integrales, y frutas y verduras frescas.  Limitar el consumo de alimentos ricos en grasa y azcares procesados, como los alimentos fritos o dulces. Estilo de vida  No beba alcohol.  No consuma ningn producto que contenga nicotina o tabaco, como cigarrillos, cigarrillos electrnicos y tabaco de Theatre manager. Si  necesita ayuda para dejar de fumar, consulte al mdico.  Duerma como mnimo 8horas todas las noches.  Encuentre modos de [employer], por ejemplo, a travs de la meditacin, la respiracin profunda o el yoga. Indicaciones generales      Lleve un registro diario para Financial risk analyst lo que Advice worker. Registre, por ejemplo, lo siguiente: ? Lo que usted come y bebe. ? El tiempo que duerme. ? Algn cambio en su dieta o en los medicamentos.  Si tiene una cefalea migraosa: ? Evite los factores que [employer] sntomas, como las luces brillantes. ? Resulta til acostarse en una habitacin oscura y silenciosa. ? No conduzca vehculos ni opere maquinaria pesada. ? Pregntele al mdico qu actividades son seguras para usted cuando tiene sntomas.  Concurra a todas las visitas de [address] se lo haya indicado el mdico. Esto es importante. Comunquese con un mdico si:  Tiene sntomas de cefalea migraosa que son distintos o ms intensos que los habituales.  Tiene ms de [address] de cefalea por mes. Solicite ayuda inmediatamente si:  La cefalea migraosa se vuelve cada vez ms intensa.  La cefalea migraosa dura ms de 72 horas.  Tiene fiebre.  Presenta rigidez  en el cuello.  Presenta prdida de la visin.  Siente debilidad en los msculos o que no puede controlarlos.  Comienza a perder el equilibrio con frecuencia.  Presenta dificultad para caminar.  Se desmaya.  Tiene una convulsin. Resumen  [place] cefalea migraosa es un dolor intenso y punzante en uno o ambos lados de la cabeza. Las migraas tambin pueden causar otros sntomas, como nuseas, vmitos y sensibilidad a la luz y el ruido.  Esta afeccin puede tratarse con medicamentos y cambios en el estilo de vida. Tambin es posible que deba evitar ciertos factores que desencadenan una cefalea migraosa.  Lleve un registro diario para Financial risk analyst lo que Financial planner.  Comunquese con el mdico si tiene ms de 15 das de cefalea en un mes o presenta sntomas de cefalea migraosa que son distintos o ms intensos que los habituales. Esta informacin no tiene Theme park manager el consejo del mdico. Asegrese de hacerle al mdico cualquier pregunta que tenga. Document Revised: 03/15/2018 Document Reviewed: 03/15/2018 Elsevier Patient Education  2020 Elsevier Inc.  Rizatriptan disintegrating tablets Qu es este medicamento? El RIZATRIPTN se [place] para tratar las migraas con o sin aura. Una aura es una sensacin o perturbacin visual particular que advierte sobre el ataque. Este medicamento no se [place] para prevenir ataques de [place]. Este medicamento puede ser utilizado para otros usos; si tiene alguna pregunta consulte con su proveedor de atencin mdica o con su farmacutico. MARCAS COMUNES: Maxalt-MLT Qu le debo informar a mi profesional de la salud antes de tomar este medicamento? Necesitan saber si usted presenta alguno de los siguientes problemas o situaciones: fuma problemas de circulacin en los dedos de manos y pies diabetes enfermedad cardiaca presin sangunea alta alto nivel de colesterol antecedentes de ritmo cardiaco irregular antecedentes de accidente cerebrovascular enfermedad renal enfermedad heptica problemas estomacales o intestinales una reaccin alrgica o inusual al rizatriptn, a otros medicamentos, alimentos, colorantes o conservantes si est embarazada o buscando quedar embarazada si est amamantando a un beb Cmo debo utilizar este medicamento? Tome este medicamento por va oral. Siga las instrucciones de la etiqueta del [place]. Deje la tableta en el empaque sellado hasta que est listo para tomarla. Abra el empaque sellado con las manos secas y saque la tableta con cuidado. Si la tableta se rompe o desmorona, deschela y tome una nueva tableta del empaque. Coloque la tableta dentro de la boca, deje que se  disuelva y luego trguela. No corte, triture ni [employer]. No necesita agua para tomar [employer]. No lo use con una frecuencia mayor a la indicada. Hable con su pediatra para informarse acerca del uso de este medicamento en nios. Aunque este medicamento se puede recetar a nios tan pequeos como de 6 aos de edad para ciertas afecciones, existen precauciones que deben tomarse. Sobredosis: Pngase en contacto inmediatamente con un centro toxicolgico o una sala de urgencia si usted cree que haya tomado demasiado medicamento. ATENCIN: [employer] es solo para usted. No comparta este medicamento con nadie. Qu sucede si me olvido de una dosis? No se aplica en este caso. Este medicamento no es para uso regular. Qu puede interactuar con este medicamento? No use esta medicina con ninguno de los siguientes medicamentos: ciertos medicamentos para la migraa, tales como almotriptn, eletriptn, frovatriptn, naratriptn, rizatriptn, sumatriptn y zolmitriptn alcaloides ergotnicos, tales como dihidroergotamina, ergonovina, ergotamina y [address] IMAO, tales como [place], [place], [place], [place] y Parnate Este medicamento tambin puede Product/process development scientist con los siguientes frmacos: ciertos medicamentos para  la depresin, ansiedad o trastornos psicticos propranolol Puede ser que esta lista no menciona todas las posibles interacciones. Informe a su profesional de [employer] de [employer] productos a base de hierbas, medicamentos de [place] o suplementos nutritivos que est tomando. Si usted fuma, consume bebidas alcohlicas o si utiliza drogas ilegales, indqueselo tambin a su profesional de [employer]. Algunas sustancias pueden interactuar con su medicamento. A qu debo estar atento al usar [employer]? Visite a su profesional de la salud para que revise regularmente su evolucin. Informe a su profesional de la salud si sus sntomas no comienzan a mejorar o si  empeoran. Puede experimentar somnolencia o mareos. No conduzca, no utilice maquinaria ni haga nada que Scientist, research (life sciences) en estado de alerta hasta que sepa cmo le afecta este medicamento. No se siente ni se ponga de pie con rapidez, especialmente si es un paciente de edad avanzada. Esto reduce el riesgo de mareos o [employer]. El alcohol puede interferir con el efecto de [place]. Se le podra secar la boca. Masticar chicle sin azcar o chupar caramelos duros y beber agua en abundancia le ayudar a mantener la boca hmeda. Si el problema no desaparece o es severo, consulte a su profesional de [employer]. Si [place] medicamentos para la migraa durante 10 das o ms por mes, sus migraas podran empeorar. Mantenga un diario de los [employer] que tuvo dolor de [place] y el uso de medicamentos. Contacte a su profesional de la salud si sus ataques de migraa se vuelven ms frecuentes. Qu efectos secundarios puedo tener al [employer] este medicamento? Efectos secundarios que debe informar a su mdico o a Producer, television/film/video de la salud tan pronto como sea posible: Therapist, art, como erupcin cutnea, comezn/picazn o urticaria, e hinchazn de la cara, los labios o la Education officer, museum u opresin en el pecho signos y sntomas de un cambio peligroso en el pulso o ritmo cardiaco, tales como dolor en el pecho; mareos; ritmo cardiaco rpido, irregular; palpitaciones; sensacin de desmayo o aturdimiento; cadas; problemas respiratorios signos y sntomas de un accidente cerebrovascular, tales como cambios en la visin; confusin; dificultad para hablar o entender; dolores de cabeza intensos; entumecimiento o debilidad repentina de la cara, el brazo o la pierna; problemas al Advertising account planner; Psychiatrist; prdida del equilibrio o la coordinacin signos y sntomas de sndrome serotoninrgico, tales como irritabilidad; confusin; diarrea; ritmo cardiaco rpido o irregular; tics musculares; msculos rgidos; problemas para caminar;  sudoracin; fiebre alta; convulsiones; escalofros; vmito Efectos secundarios que generalmente no requieren atencin mdica (infrmelos a su mdico o a Producer, television/film/video de la salud si persisten o si son molestos): diarrea mareos somnolencia boca seca dolor de cabeza nuseas, vmito dolor, hormigueo o entumecimiento de las manos o los pies dolor estomacal Puede ser que esta lista no menciona todos los posibles efectos secundarios. Comunquese a su mdico por asesoramiento mdico [employer]ard. Usted puede informar los efectos secundarios a la FDA por telfono al 1-800-FDA-1088. Dnde debo guardar mi medicina? Mantngala fuera del alcance de los nios. Gurdela a [employer], entre 15 y 30 grados C (46 y 51 grados F). Protjala de la luz y de la humedad. Deseche todo el medicamento que no haya utilizado, despus de la fecha de vencimiento. ATENCIN: Este folleto es un resumen. Puede ser que no cubra toda la posible informacin. Si usted tiene preguntas acerca de esta medicina, consulte con su mdico, su farmacutico o su profesional de Radiographer, therapeutic.  2020 Elsevier/Gold Standard (2017-10-25 00:00:00)  Ondansetron oral dissolving tablet Qu es este medicamento? El ONDANSETRN se [place] para tratar las nuseas y el vmito causados por la quimioterapia. Tambin se [place] para prevenir o tratar las nuseas y el vmito despus de una operacin. Este medicamento puede ser utilizado para otros usos; si tiene alguna pregunta consulte con su proveedor de atencin mdica o con su farmacutico. MARCAS COMUNES: Zofran ODT Qu le debo informar a mi profesional de la salud antes de tomar este medicamento? Necesita saber si usted presenta alguno de los siguientes problemas o situaciones:  enfermedad cardiaca  antecedentes de pulso cardiaco irregular  enfermedad heptica  niveles bajos de magnesio o potasio en la sangre  una reaccin alrgica o inusual al ondansetrn, granisetrn, a  otros medicamentos, alimentos, colorantes o conservantes  si est embarazada o buscando quedar embarazada  si est amamantando a un beb Cmo debo [employer]? Estas tabletas estn hechas para disolverse en la boca. No empuje la tableta a travs del envase de aluminio. Con las manos secas remueva la tableta con cuidado de su envase. Coloque la tableta en la boca y Mining engineer. Cuando se disuelva, ingirala. Estas tabletas se pueden tomar con agua, pero no es necesario que lo haga. Hable con su pediatra para informarse acerca del uso de este medicamento en nios. Puede requerir atencin especial. Sobredosis: Pngase en contacto inmediatamente con un centro toxicolgico o una sala de urgencia si usted cree que haya tomado demasiado medicamento. ATENCIN: [employer] es solo para usted. No comparta este medicamento con nadie. Qu sucede si me olvido de una dosis? Si olvida una dosis, tmela lo antes posible. Si es casi la hora de la prxima dosis, tome slo esa dosis. No tome dosis adicionales o dobles. Qu puede interactuar con este medicamento? No use este medicamento con ninguno de los siguientes frmacos: apomorfina ciertos medicamentos para infecciones micticas, tales como fluconazol, itraconazol, ketoconazol, posaconazol y voriconazol cisaprida dronedarona pimozida tioridazina Este medicamento tambin puede interactuar con los siguientes frmacos: carbamazepina ciertos medicamentos para la depresin, ansiedad o trastornos psicticos fentanilo linezolida IMAO, tales como [place], [place], [place], [place] y Parnate azul de metileno (inyectado en una vena) otros medicamentos que prolongan el intervalo QT (causan un ritmo cardiaco anormal) tales como dofetilida, ziprasidona fenitona rifampicina tramadol Puede ser que esta lista no menciona todas las posibles interacciones. Informe a su profesional de [employer]	[place] de [employer] productos a base de hierbas, medicamentos de [place] o suplementos nutritivos que est tomando. Si usted fuma, consume bebidas alcohlicas o si utiliza drogas ilegales, indqueselo tambin a su profesional de [employer]	[place]. Algunas sustancias pueden interactuar con su medicamento. A qu debo estar atento al usar [employer]? Si experimenta algn signo de reaccin alrgica, consulte a su mdico o a su profesional de [employer]	[place] lo antes posible. Qu efectos secundarios puedo tener al [employer] este medicamento? Efectos secundarios que debe informar a su mdico o a Barrister's clerk de la salud tan pronto como sea posible:  Chief of Staff como erupcin cutnea, picazn o urticarias, hinchazn de la cara, labios o lengua  problemas respiratorios  confusin  mareos  pulso cardiaco rpido o irregular  sensacin de desmayos o aturdimiento, cadas  fiebre y escalofros  prdida del equilibrio o coordinacin  convulsiones  sudoracin  hinchazn de las manos o pies  opresin en el pecho  temblores  cansancio o debilidad inusual Efectos secundarios que, por lo general, no requieren atencin mdica (debe informarlos a su mdico o a su profesional de la  salud si persisten o si son molestos):  estreimiento o diarrea  dolor de cabeza Puede ser que esta lista no menciona todos los posibles efectos secundarios. Comunquese a su mdico por asesoramiento mdico [employer]lett[employer]ard. Usted puede informar los efectos secundarios a la FDA por telfono al 1-800-FDA-1088. Dnde debo guardar mi medicina? Mantngala fuera del alcance de los nios. Gurdela a una temperatura de [place] 2 y 30 grados C (36 y 22 grados F). Deseche todo el medicamento que no haya utilizado, despus de la fecha de vencimiento. ATENCIN: Este folleto es un resumen. Puede ser que no cubra toda la posible informacin. Si usted tiene preguntas acerca de esta medicina, consulte con su mdico, su farmacutico o su profesional de Radiographer, therapeutic.  2020 Elsevier/Gold  Standard (2018-06-10 00:00:00)   Topiramate tablets Qu es este medicamento? El TOPIRAMATO se [place] para Chief Operating Officer las convulsiones en adultos o nios con epilepsia. Este medicamento tambin se [place] para [employer]. Este medicamento puede ser utilizado para otros usos; si tiene alguna pregunta consulte con su proveedor de atencin mdica o con su farmacutico. MARCAS COMUNES: Topamax, Topiragen Qu le debo informar a mi profesional de la salud antes de tomar este medicamento? Necesitan saber si usted presenta alguno de los [employer] o situaciones: trastornos de sangrado enfermedad renal enfermedad pulmonar o respiratoria, como asma ideas, planes o intento de suicidio; si usted o alguien de su familia ha intentado suicidarse previamente una reaccin alrgica o inusual al topiramato, a otros medicamentos, alimentos, colorantes o conservantes si est embarazada o buscando quedar embarazada si est amamantando a un beb Cmo debo [employer]? Tome este medicamento por va oral con un vaso de agua. Siga las instrucciones de la etiqueta del [place]. No corte, triture ni [employer]. Tome las tabletas enteras. Puede tomarlo con o sin alimentos. Si el Social worker, tmelo con alimentos. Use su medicamento a intervalos regulares. No lo use con una frecuencia mayor a la indicada. No deje de usarlo, excepto si as lo indica su mdico. Su farmacutico le dar una [place] del medicamento especial (MedGuide, nombre en ingls) con cada receta y en cada ocasin que la vuelva a surtir. Asegrese de leer esta informacin cada vez cuidadosamente. Hable con su pediatra para informarse acerca del uso de este medicamento en nios. Aunque este medicamento se puede recetar a nios tan pequeos como de 2 aos de edad con ciertas afecciones, existen precauciones que deben tomarse. Sobredosis: Pngase en contacto inmediatamente con un centro  toxicolgico o una sala de urgencia si usted cree que haya tomado demasiado medicamento. ATENCIN: [employer] es solo para usted. No comparta este medicamento con nadie. Qu sucede si me olvido de una dosis? Si se olvida una dosis, tmela lo antes posible. Si falta menos de 6 horas para la prxima dosis, entonces no tome la dosis olvidada. Tome la prxima dosis a la hora regular. No tome dosis adicionales o dobles. Qu puede interactuar con este medicamento? Este medicamento podra interactuar con los siguientes frmacos: acetazolamida alcohol antihistamnicos para Programmer, multimedia, tos y resfro aspirina y medicamentos tipo aspirina atropina pldoras anticonceptivas ciertos medicamentos para la ansiedad o para dormir ciertos medicamentos para problemas de vejiga, tales como oxibutinina y tolterodina ciertos medicamentos para la depresin, tales como amitriptilina, fluoxetina, sertralina ciertos medicamentos para convulsiones, tales como carbamazepina, fenobarbital, fenitona, primidona, cido valproico, zonisamida ciertos medicamentos para problemas estomacales, tales como diciclomina e hiosciamina ciertos medicamentos para el mareo por movimiento, como escopolamina ciertos medicamentos  para el mal de Parkinson, tales como benzatropina y trihexifenidilo ciertos medicamentos que tratan o previenen cogulos sanguneos, como warfarina, enoxaparina, dalteparina, apixabn, dabigatrn y rivaroxabn digoxina anestsicos generales, tales como halotano, isoflurano, metoxiflurano, propofol hidroclorotiazida ipratropio litio medicamentos para relajar los msculos antes de una ciruga metformina medicamentos narcticos para el dolor AINE, medicamentos para el dolor y la inflamacin, tales como ibuprofeno o naproxeno fenotiazinas, tales como Barista, Manufacturing engineer, Paediatric nurse, tioridazina pioglitazona Puede ser que esta lista no menciona todas las posibles interacciones. Informe a su profesional de [employer] de  [employer] productos a base de hierbas, medicamentos de [place] o suplementos nutritivos que est tomando. Si usted fuma, consume bebidas alcohlicas o si utiliza drogas ilegales, indqueselo tambin a su profesional de [employer]. Algunas sustancias pueden interactuar con su medicamento. A qu debo estar atento al usar [employer]? Visite a su mdico o a su profesional de la salud para que revise regularmente su evolucin. Informe a su profesional de la salud si sus sntomas no comienzan a mejorar o si empeoran. No deje de tomarlo, excepto si as lo indica su profesional de [employer]. Usted puede desarrollar una reaccin grave. Su profesional de Community education officer dir cunto medicamento tomar. Use un brazalete o una cadena de identificacin mdica. Lleve con usted una tarjeta que describa su enfermedad, detalles de su medicamento y horario de dosis. Este medicamento puede reducir la respuesta de su cuerpo al fro o al calor. Vstase con ropa abrigada si el clima es fro y [place] hidratado si hace calor. Si es posible, evite temperaturas extremas, como las de saunas, [address] de agua caliente, [address] fras o muy calientes, o actividades que puedan causar deshidratacin, como el ejercicio intenso. Consulte con su profesional de la salud si tiene diarrea grave, nuseas y vmitos, o sudoracin intensa. La prdida de demasiado lquido corporal puede hacer que sea peligroso usar [employer]. Puede experimentar somnolencia o mareos. No conduzca, no utilice maquinaria ni haga nada que Scientist, research (life sciences) en estado de alerta hasta que sepa cmo le afecta este medicamento. No se siente ni se ponga de pie con rapidez, especialmente si es un paciente de edad avanzada. Esto reduce el riesgo de mareos o [employer]. El alcohol puede interferir con el efecto de [place]. Evite consumir bebidas alcohlicas. Si observa algn cambio en la vista, infrmelo de inmediato a su profesional de [employer]. Los  pacientes y sus familias deben prestar atencin para Landscape architect aparicin o el empeoramiento de la depresin o las ideas suicidas. Tambin es necesario estar atento ante [employer], tales como sentirse ansioso, agitado, con pnico, irritable, hostil, agresivo, impulsivo, muy inquieto, excitado en exceso e hiperactivo, o no poder dormir. Si esto sucede, especialmente en el comienzo del tratamiento o despus de un cambio de dosis, llame a su profesional de [employer]. Este medicamento puede causar reacciones graves en la piel. Pueden suceder semanas a meses despus de comenzar a Astronomer. Contacte a su proveedor de atencin mdica de inmediato si nota que tiene fiebre o sntomas gripales con una erupcin. La erupcin puede ser roja o Clarisa Fling, y luego puede convertirse en ampollas o descamacin de la piel. O bien, es posible que observe una erupcin roja con hinchazn en la cara, los labios o los ganglios linfticos en el cuello o debajo de los brazos. Los anticonceptivos pueden no Chief Strategy Officer est utilizando [employer]. Hable con su profesional de la [employer] uso de un mtodo  anticonceptivo adicional. Las mujeres deben informar a su profesional de la salud si estn buscando quedar embarazadas o si creen que podran estar embarazadas. Existe la posibilidad de efectos secundarios graves y daos en un beb sin nacer. Para obtener ms informacin, hable con su profesional de [employer] Homesla salud. Qu efectos secundarios puedo tener al Boston Scientificutilizar este medicamento? Efectos secundarios que debe informar a su mdico o a Producer, television/film/videosu profesional de la salud tan pronto como sea posible: Therapist, artreacciones alrgicas, como erupcin cutnea, comezn/picazn o urticaria, e hinchazn de la cara, los labios o la lengua sangre en la orina cambios en la visin confusin prdida de Engineer, maintenancememoria dolor en la regin baja de la espalda o el costado dolor al Geographical information systems officerorinar enrojecimiento, formacin de ampollas, descamacin o  distensin de la piel, incluso dentro de la boca signos y sntomas de Landscape architectsangrado, tales como heces con sangre o de color negro y Water engineeraspecto alquitranado; [place] de color rojo o marrn oscuro; escupir sangre o material marrn que tiene el aspecto de granos de caf molido; Regulatory affairs officermanchas rojas en la piel; sangrado o moretones inusuales en los ojos, las encas o la nariz signos y sntomas de aumento del cido en el cuerpo, tales como respiracin rpida; frecuencia cardiaca rpida; dolor de cabeza; confusin; debilidad o cansancio inusual; nuseas, vmito ideas suicidas, cambios en el estado de nimo dificultad para hablar o comprender sudoracin inusual cansancio o debilidad inusual Efectos secundarios que generalmente no requieren atencin mdica (infrmelos a su mdico o a su profesional de la salud si persisten o si son molestos): mareos somnolencia fiebre prdida del apetito nuseas, Architectvmito dolor, hormigueo o entumecimiento de las manos o los pies dolor estomacal cansancio Programme researcher, broadcasting/film/videomalestar estomacal Puede ser que esta lista no menciona todos los posibles efectos secundarios. Comunquese a su mdico por asesoramiento mdico Hewlett-Packardsobre los efectos secundarios. Usted puede informar los efectos secundarios a la FDA por telfono al 1-800-FDA-1088. Dnde debo guardar mi medicina? Mantenga fuera del alcance de los nios. Guarde a Sanmina-SCItemperatura ambiente, entre 15 y 30 grados Celsius (59 y 6386 grados Fahrenheit). Deseche todo el medicamento que no haya utilizado despus de la fecha de vencimiento. ATENCIN: Este folleto es un resumen. Puede ser que no cubra toda la posible informacin. Si usted tiene preguntas acerca de esta medicina, consulte con su mdico, su farmacutico o su profesional de Radiographer, therapeuticla salud.  2020 Elsevier/Gold Standard (2018-11-13 00:00:00)

## 2019-04-03 NOTE — Telephone Encounter (Signed)
Schedule this appointment with Amy NP per Dr. Trevor Mace request.

## 2019-04-03 NOTE — Telephone Encounter (Signed)
Called pt using [employer] and LVM asking for call back from pt or her mother Idolina (on [place]) to schedule pt for 3 month follow up.  Please schedule the patient for a 3 month follow-up (for June 2021) when she calls back.

## 2019-04-10 NOTE — Telephone Encounter (Signed)
Called pt again using [employer] and the interpreter LVM asking for pt to call back and schedule a 3 month follow up.

## 2019-06-18 ENCOUNTER — Other Ambulatory Visit: Payer: Self-pay

## 2019-06-18 DIAGNOSIS — Z1231 Encounter for screening mammogram for malignant neoplasm of breast: Secondary | ICD-10-CM

## 2019-08-26 ENCOUNTER — Other Ambulatory Visit: Payer: No Typology Code available for payment source

## 2019-08-26 ENCOUNTER — Ambulatory Visit: Payer: No Typology Code available for payment source

## 2019-10-06 ENCOUNTER — Ambulatory Visit: Payer: No Typology Code available for payment source | Admitting: Nurse Practitioner

## 2021-03-13 ENCOUNTER — Encounter (HOSPITAL_COMMUNITY): Payer: Self-pay | Admitting: Emergency Medicine

## 2021-03-13 ENCOUNTER — Other Ambulatory Visit: Payer: Self-pay

## 2021-03-13 ENCOUNTER — Emergency Department (HOSPITAL_COMMUNITY)
Admission: EM | Admit: 2021-03-13 | Discharge: 2021-03-13 | Disposition: A | Discharge status: Home / Self Care | Payer: No Typology Code available for payment source | Attending: Emergency Medicine | Admitting: Emergency Medicine

## 2021-03-13 ENCOUNTER — Emergency Department (HOSPITAL_COMMUNITY): Payer: No Typology Code available for payment source

## 2021-03-13 DIAGNOSIS — M25552 Pain in left hip: Secondary | ICD-10-CM | POA: Insufficient documentation

## 2021-03-13 DIAGNOSIS — R1032 Left lower quadrant pain: Secondary | ICD-10-CM | POA: Insufficient documentation

## 2021-03-13 DIAGNOSIS — N9489 Other specified conditions associated with female genital organs and menstrual cycle: Secondary | ICD-10-CM | POA: Insufficient documentation

## 2021-03-13 DIAGNOSIS — R102 Pelvic and perineal pain: Secondary | ICD-10-CM

## 2021-03-13 LAB — CBC
HCT: 41.1 % (ref 36.0–46.0)
Hemoglobin: 13.6 g/dL (ref 12.0–15.0)
MCH: 30.3 pg (ref 26.0–34.0)
MCHC: 33.1 g/dL (ref 30.0–36.0)
MCV: 91.5 fL (ref 80.0–100.0)
Platelets: 359 10*3/uL (ref 150–400)
RBC: 4.49 MIL/uL (ref 3.87–5.11)
RDW: 12.8 % (ref 11.5–15.5)
WBC: 8.8 10*3/uL (ref 4.0–10.5)
nRBC: 0 % (ref 0.0–0.2)

## 2021-03-13 LAB — LIPASE, BLOOD: Lipase: 32 U/L (ref 11–51)

## 2021-03-13 LAB — COMPREHENSIVE METABOLIC PANEL
ALT: 16 U/L (ref 0–44)
AST: 16 U/L (ref 15–41)
Albumin: 4 g/dL (ref 3.5–5.0)
Alkaline Phosphatase: 49 U/L (ref 38–126)
Anion gap: 8 (ref 5–15)
BUN: 16 mg/dL (ref 6–20)
CO2: 26 mmol/L (ref 22–32)
Calcium: 9.2 mg/dL (ref 8.9–10.3)
Chloride: 104 mmol/L (ref 98–111)
Creatinine, Ser: 0.56 mg/dL (ref 0.44–1.00)
GFR, Estimated: >60 mL/min (ref >60)
Glucose, Bld: 101 mg/dL — ABNORMAL HIGH (ref 70–99)
Potassium: 3.9 mmol/L (ref 3.5–5.1)
Sodium: 138 mmol/L (ref 135–145)
Total Bilirubin: 0.1 mg/dL — ABNORMAL LOW (ref 0.3–1.2)
Total Protein: 7.4 g/dL (ref 6.5–8.1)

## 2021-03-13 LAB — URINALYSIS, ROUTINE W REFLEX MICROSCOPIC
Bilirubin Urine: NEGATIVE
Glucose, UA: NEGATIVE mg/dL
Hgb urine dipstick: NEGATIVE
Ketones, ur: NEGATIVE mg/dL
Leukocytes,Ua: NEGATIVE
Nitrite: NEGATIVE
Protein, ur: NEGATIVE mg/dL
Specific Gravity, Urine: 1.015 (ref 1.005–1.030)
pH: 8 (ref 5.0–8.0)

## 2021-03-13 LAB — PREGNANCY, URINE: Preg Test, Ur: NEGATIVE

## 2021-03-13 MED ORDER — IBUPROFEN 600 MG PO TABS: 600.0000 mg | ORAL_TABLET | Freq: Four times a day (QID) | ORAL | 0 refills | Status: AC | PRN

## 2021-03-13 MED ORDER — ACETAMINOPHEN 325 MG PO TABS: 650.0000 mg | ORAL_TABLET | Freq: Four times a day (QID) | ORAL | 0 refills | Status: DC | PRN

## 2021-03-13 MED ORDER — ACETAMINOPHEN 325 MG PO TABS
650.0000 mg | ORAL_TABLET | Freq: Once | ORAL | Status: AC
  Administered 2021-03-13: 650 mg via ORAL
  Filled 2021-03-13: qty 2

## 2021-03-13 MED ORDER — IBUPROFEN 800 MG PO TABS
800.0000 mg | ORAL_TABLET | Freq: Once | ORAL | Status: AC
Start: 2021-03-13 — End: 2021-03-13
  Administered 2021-03-13: 800 mg via ORAL
  Filled 2021-03-13: qty 1

## 2021-03-13 NOTE — ED Provider Notes (Signed)
[facility] EMERGENCY DEPARTMENT Provider Note   CSN: [id number] Arrival date & time: 03/13/21  [place]     History  Chief Complaint  Patient presents with   Abdominal Pain    Alexandra Ford is a 41 y.o. female presenting to emergency department with left lower abdominal pain and pelvic pain.  Patient reports onset of symptoms about a week ago, but the pain became more persistent last night.  She says it feels similar to when she had "problems with my left ovary" and says she had a history of polycystic ovarian disease.  She does have a Mirena IUD in place and is not currently on her menstrual period.  She denies nausea, vomiting, constipation, diarrhea.  She denies dysuria or history of kidney stones.  She denies any abdominal surgeries.  The pain is sharp and stabbing, worse with certain movements.  She also suffers from sciatica.  She says she took some Tylenol today at 3:00 but has not taken any other medications.  HPI     Home Medications Prior to Admission medications   Medication Sig Start Date End Date Taking? Authorizing Provider  acetaminophen (TYLENOL) 325 MG tablet Take 2 tablets (650 mg total) by mouth every 6 (six) hours as needed for up to 30 doses. 03/13/21  Yes Cheyann Blecha, Alexandra Rhine, MD  ibuprofen (ADVIL) 600 MG tablet Take 1 tablet (600 mg total) by mouth every 6 (six) hours as needed for mild pain or moderate pain. 03/13/21 04/12/21 Yes Adeena Bernabe, Alexandra Rhine, MD  Acetaminophen (TYLENOL PO) Take by mouth as needed.    [provider]  Ascorbic Acid (VITAMIN C PO) Take by mouth.    [provider]  ondansetron (ZOFRAN-ODT) 4 MG disintegrating tablet Take 1 tablet (4 mg total) by mouth every 8 (eight) hours as needed for nausea. 04/03/19   Melvenia Beam, MD  Probiotic Product (PROBIOTIC PO) Take by mouth.    [provider]  rizatriptan (MAXALT-MLT) 10 MG disintegrating tablet Take 1 tablet (10 mg total) by mouth as needed for  migraine. May repeat in 2 hours if needed 04/03/19   Melvenia Beam, MD  topiramate (TOPAMAX) 50 MG tablet Take 1 tablet (50 mg total) by mouth 2 (two) times daily. 04/03/19   Melvenia Beam, MD  UNABLE TO FIND Med Name: iodine drops to water    [provider]  VITAMIN D PO Take by mouth.    [provider]  SUMAtriptan (IMITREX) 50 MG tablet Take 50 mg by mouth as directed.  04/03/19  [provider]      Allergies    Penicillins    Review of Systems   Review of Systems  Physical Exam Updated Vital Signs BP 126/74    Pulse 80    Temp 98.2 F (36.8 C) (Oral)    Resp 16    SpO2 99%  Physical Exam Constitutional:      General: She is not in acute distress. HENT:     Head: Normocephalic and atraumatic.  Eyes:     Conjunctiva/sclera: Conjunctivae normal.     Pupils: Pupils are equal, round, and reactive to light.  Cardiovascular:     Rate and Rhythm: Normal rate and regular rhythm.  Pulmonary:     Effort: Pulmonary effort is normal. No respiratory distress.  Abdominal:     General: There is no distension.     Tenderness: There is no abdominal tenderness. There is no guarding or rebound. Negative  signs include Murphy's sign and McBurney's sign.  Skin:    General: Skin is warm and dry.  Neurological:     General: No focal deficit present.     Mental Status: She is alert. Mental status is at baseline.  Psychiatric:        Mood and Affect: Mood normal.        Behavior: Behavior normal.    ED Results / Procedures / Treatments   Labs (all labs ordered are listed, but only abnormal results are displayed) Labs Reviewed  COMPREHENSIVE METABOLIC PANEL - Abnormal; Notable for the following components:      Result Value   Glucose, Bld 101 (*)    Total Bilirubin 0.1 (*)    All other components within normal limits  LIPASE, BLOOD  CBC  URINALYSIS, ROUTINE W REFLEX MICROSCOPIC  PREGNANCY, URINE  I-STAT BETA HCG BLOOD, ED ([facility], [facility], [facility] ONLY)     EKG None  Radiology US PELVIC COMPLETE W TRANSVAGINAL AND TORSION R/O  Result Date: 03/13/2021 CLINICAL DATA:  Initial evaluation for acute left lower quadrant pain. EXAM: TRANSABDOMINAL AND TRANSVAGINAL ULTRASOUND OF PELVIS DOPPLER ULTRASOUND OF OVARIES TECHNIQUE: Both transabdominal and transvaginal ultrasound examinations of the pelvis were performed. Transabdominal technique was performed for global imaging of the pelvis including uterus, ovaries, adnexal regions, and pelvic cul-de-sac. It was necessary to proceed with endovaginal exam following the transabdominal exam to visualize the endometrium and ovaries. Color and duplex Doppler ultrasound was utilized to evaluate blood flow to the ovaries. COMPARISON:  None available. FINDINGS: Uterus Measurements: 8.9 x 3.5 x 4.3 cm = volume: 70.8 mL. Uterus is anteverted. No discrete fibroid or other myometrial abnormality. Endometrium Thickness: 4.8 mm. No focal abnormality visualized. IUD appropriately in place within the endometrial cavity. Right ovary Measurements: 3.6 x 2.2 x 2.0 cm = volume: 8.2 mL. Normal appearance/no adnexal mass. Left ovary Measurements: 3.7 x 2.0 x 3.7 cm = volume: 14.6 mL. Normal appearance/no adnexal mass. Pulsed Doppler evaluation of both ovaries demonstrates normal low-resistance arterial and venous waveforms. Other findings No abnormal free fluid. IMPRESSION: 1. Normal pelvic ultrasound. No evidence for ovarian torsion or other acute abnormality. 2. IUD appropriately in place within the endometrial cavity. Electronically Signed   By: Jeannine Boga M.D.   On: 03/13/2021 21:05    Procedures Procedures    Medications Ordered in ED Medications  ibuprofen (ADVIL) tablet 800 mg (800 mg Oral Given 03/13/21 2023)  acetaminophen (TYLENOL) tablet 650 mg (650 mg Oral Given 03/13/21 2023)    ED Course/ Medical Decision Making/ A&P Clinical Course as of 03/13/21 2307  Sun Mar 13, 2021  2145 Abdominal and pelvic pain is  significant improved after the Motrin and Tylenol.  Ultrasound is unremarkable, does not show evidence of torsion or ovarian enlargement or ruptured cyst.  I reassessed the patient I do believe this is likely a musculoskeletal issue, given how positional it is and how it wraps around the left side of her head.  I advised her to continue conservative management at home and follow-up with her PCP. [MT]    Clinical Course User Index [MT] Alexandra Ford, Alexandra Rhine, MD                           Medical Decision Making Amount and/or Complexity of Data Reviewed Labs: ordered. Radiology: ordered. ECG/medicine tests: ordered.  Risk OTC drugs. Prescription drug management.   This patient presents to the Emergency Department with complaint of  abdominal pain. This involves an extensive number of treatment options, and is a complaint that carries with it a high risk of complications and morbidity.  The differential diagnosis includes, but is not limited to, ruptured ovarian cyst versus torsion versus cystitis versus other  History is also suggestive of possible sciatica referred radicular pain given that her symptoms are worse with walking and standing and reproducible with range of motion of the left hip.  Doubt acute hip fracture with no mechanism.  I ordered, reviewed, and interpreted labs, including CMP and CBC.  There were no immediate, life-threatening emergencies found in this labwork.  Patient's UA showed no signs of infection, no blood to suggest kidney stone I ordered medication Motrin, Tylenol for abdominal pain I ordered imaging studies which included pelvic ultrasound I independently visualized and interpreted imaging which showed no significant findings on pelvic ultrasound to explain the patient's symptoms and the monitor tracing which showed normal sinus rhythm  With no GI symptoms no reproducible abdominal tenderness on exam of the lower suspicion for colitis, appendicitis, other acute  intra-abdominal process at this time.   After the interventions stated above, I reevaluated the patient and found that they remained clinically stable.  Based on the patient's clinical exam, vital signs, risk factors, and ED testing, I felt that the patient's overall risk of life-threatening emergency such as bowel perforation, surgical emergency, or sepsis was quite low.  I suspect this clinical presentation is most consistent with musculoskeletal pain, but explained to the patient that this evaluation was not a definitive diagnostic workup.  I discussed outpatient follow up with primary care provider, and provided specialist office number on the patient's discharge paper if a referral was deemed necessary.  I discussed return precautions with the patient. I felt the patient was clinically stable for discharge.         Final Clinical Impression(s) / ED Diagnoses Final diagnoses:  Left hip pain    Rx / DC Orders ED Discharge Orders          Ordered    ibuprofen (ADVIL) 600 MG tablet  Every 6 hours PRN        03/13/21 2147    acetaminophen (TYLENOL) 325 MG tablet  Every 6 hours PRN        03/13/21 2147              Wyvonnia Dusky, MD 03/13/21 2307

## 2021-03-13 NOTE — ED Triage Notes (Addendum)
C/o LLQ pain since last night.  Denies nausea, vomiting, and diarrhea.  Denies urinary complaint.

## 2021-03-14 LAB — I-STAT BETA HCG BLOOD, ED (MC, WL, AP ONLY): I-stat hCG, quantitative: <5 m[IU]/mL (ref <5)

## 2022-05-04 ENCOUNTER — Telehealth: Payer: Self-pay

## 2022-05-04 NOTE — Telephone Encounter (Signed)
Telephoned patient using interpreter#[phone]. Voice mail not an option, [facility] (scholarship).

## 2022-12-26 ENCOUNTER — Other Ambulatory Visit: Payer: Self-pay

## 2022-12-26 DIAGNOSIS — R921 Mammographic calcification found on diagnostic imaging of breast: Secondary | ICD-10-CM

## 2022-12-26 DIAGNOSIS — Z1231 Encounter for screening mammogram for malignant neoplasm of breast: Secondary | ICD-10-CM

## 2023-01-11 ENCOUNTER — Ambulatory Visit
Admission: EM | Admit: 2023-01-11 | Discharge: 2023-01-11 | Disposition: A | Discharge status: Home / Self Care | Payer: No Typology Code available for payment source | Attending: Family Medicine | Admitting: Family Medicine

## 2023-01-11 DIAGNOSIS — R110 Nausea: Secondary | ICD-10-CM

## 2023-01-11 DIAGNOSIS — R509 Fever, unspecified: Secondary | ICD-10-CM

## 2023-01-11 DIAGNOSIS — J069 Acute upper respiratory infection, unspecified: Secondary | ICD-10-CM

## 2023-01-11 DIAGNOSIS — R82998 Other abnormal findings in urine: Secondary | ICD-10-CM

## 2023-01-11 LAB — POCT URINALYSIS DIP (MANUAL ENTRY)
Bilirubin, UA: NEGATIVE
Blood, UA: NEGATIVE
Glucose, UA: NEGATIVE mg/dL
Nitrite, UA: NEGATIVE
Spec Grav, UA: >=1.03 — AB (ref 1.010–1.025)
Urobilinogen, UA: 0.2 U/dL
pH, UA: 5.5 (ref 5.0–8.0)

## 2023-01-11 LAB — POCT INFLUENZA A/B
Influenza A, POC: NEGATIVE
Influenza B, POC: NEGATIVE

## 2023-01-11 MED ORDER — ACETAMINOPHEN 325 MG PO TABS
650.0000 mg | ORAL_TABLET | Freq: Once | ORAL | Status: AC
  Administered 2023-01-11: 650 mg via ORAL

## 2023-01-11 MED ORDER — IBUPROFEN 800 MG PO TABS: 800.0000 mg | ORAL_TABLET | Freq: Once | ORAL | Status: DC

## 2023-01-11 MED ORDER — PROMETHAZINE-DM 6.25-15 MG/5ML PO SYRP: 5.0000 mL | ORAL_SOLUTION | Freq: Four times a day (QID) | ORAL | 0 refills | Status: DC | PRN

## 2023-01-11 MED ORDER — KETOROLAC TROMETHAMINE 60 MG/2ML IM SOLN
60.0000 mg | Freq: Once | INTRAMUSCULAR | Status: AC
  Administered 2023-01-11: 60 mg via INTRAMUSCULAR

## 2023-01-11 MED ORDER — ONDANSETRON 4 MG PO TBDP
4.0000 mg | ORAL_TABLET | Freq: Once | ORAL | Status: AC
  Administered 2023-01-11: 4 mg via ORAL

## 2023-01-11 MED ORDER — ONDANSETRON 4 MG PO TBDP: 4.0000 mg | ORAL_TABLET | Freq: Three times a day (TID) | ORAL | 0 refills | Status: DC | PRN

## 2023-01-11 NOTE — Discharge Instructions (Addendum)
If not allergic, you may use over the counter ibuprofen or acetaminophen as needed. You have had labs (a urine culture) sent today. We will call you with any significant abnormalities or if there is need to begin or change treatment or pursue further follow up.  You may also review your test results online through MyChart. If you do not have a MyChart account, instructions to sign up should be on your discharge paperwork.  If not allergic, you may use over the counter ibuprofen or acetaminophen as needed for pain and fever.

## 2023-01-11 NOTE — ED Triage Notes (Signed)
Patient presents with body and headache, cough, vomiting, temperature of 100.4 last night. Treated with Tylenol. All symptoms started last night.

## 2023-01-11 NOTE — ED Provider Notes (Signed)
[facility]   [id number] 01/11/23 Arrival Time: 1440  ASSESSMENT & PLAN:  1. Fever, unspecified fever cause   2. Viral URI with cough   3. Nausea without vomiting   4. Urine leukocytes    Feeling better after: Meds ordered this encounter  Medications   acetaminophen (TYLENOL) tablet 650 mg   ondansetron (ZOFRAN-ODT) disintegrating tablet 4 mg   ketorolac (TORADOL) injection 60 mg   Suspect acute viral resp infection. U/A with small leuks. Urine culture sent. Denies urinary symptoms. Is dehydrated. Encouraged to ensure adequate fluid intake.  Meds ordered this encounter   promethazine-dextromethorphan (PROMETHAZINE-DM) 6.25-15 MG/5ML syrup    Sig: Take 5 mLs by mouth 4 (four) times daily as needed for cough.    Dispense:  118 mL    Refill:  0   ondansetron (ZOFRAN-ODT) 4 MG disintegrating tablet    Sig: Take 1 tablet (4 mg total) by mouth every 8 (eight) hours as needed for nausea or vomiting.    Dispense:  20 tablet    Refill:  0   Work note provided.  Follow-up Information     [facility]).   Specialty: Urgent Care Why: If worsening or failing to improve as anticipated. Contact information: [address] [place] [address] [phone]                Reviewed expectations re: course of current medical issues. Questions answered. Outlined signs and symptoms indicating need for more acute intervention. Understanding verbalized. After Visit Summary given.   SUBJECTIVE: History from: Patient and family. Alexandra Ford Alexandra Ford is a 42 y.o. female. Reports: body aches, HA, cough, post-tussive emesis; abrupt onset yest evening with Tmax 100.77F. Denies: difficulty breathing. Decreased PO intake.  OBJECTIVE:  Vitals:   01/11/23 1619 01/11/23 1623 01/11/23 1757  BP: 105/70    Pulse: (!) 118    Resp: 16    Temp: (!) 103.1 F (39.5 C)  100.2 F (37.9 C)  TempSrc: Oral   Oral  SpO2: 99%    Weight:  90.7 kg   Height:  5\' 3"  (1.6 m)     P and Temp noted.  General appearance: alert; no distress but appears fatigued Eyes: PERRLA; EOMI; conjunctiva normal HENT: [place]; AT; with nasal congestion Neck: supple  Lungs: speaks full sentences without difficulty; unlabored; CTAB; active cough Extremities: no edema Skin: warm and dry Neurologic: normal gait Psychological: alert and cooperative; normal mood and affect  Labs: Results for orders placed or performed during the hospital encounter of 01/11/23  POCT Influenza A/B   Collection Time: 01/11/23  5:00 PM  Result Value Ref Range   Influenza A, POC Negative Negative   Influenza B, POC Negative Negative  POCT urinalysis dipstick   Collection Time: 01/11/23  6:05 PM  Result Value Ref Range   Color, UA yellow yellow   Clarity, UA cloudy (A) clear   Glucose, UA negative negative mg/dL   Bilirubin, UA negative negative   Ketones, POC UA small (15) (A) negative mg/dL   Spec Grav, UA >=6.295 (A) 1.010 - 1.025   Blood, UA negative negative   pH, UA 5.5 5.0 - 8.0   Protein Ur, POC trace (A) negative mg/dL   Urobilinogen, UA 0.2 0.2 or 1.0 E.U./dL   Nitrite, UA Negative Negative   Leukocytes, UA Trace (A) Negative      Allergies  Allergen Reactions   Penicillins Itching    Past Medical  History:  Diagnosis Date   Gestational diabetes    Migraines    Social History   Socioeconomic History   Marital status: Married    Spouse name: Not on file   Number of children: 2   Years of education: Not on file   Highest education level: Not on file  Occupational History   Occupation: cleans houses  Tobacco Use   Smoking status: Never   Smokeless tobacco: Never  Vaping Use   Vaping status: Never Used  Substance and Sexual Activity   Alcohol use: No   Drug use: No   Sexual activity: Yes    Birth control/protection: I.U.D.  Other Topics Concern   Not on file  Social History Narrative   Lives with  husband & children   Right handed   Caffeine: coffee (espresso) 1 cup/day. Sometimes she takes 2 pepsis but not daily   Social Drivers of Corporate investment banker Strain: Not on file  Food Insecurity: Not on file  Transportation Needs: Not on file  Physical Activity: Not on file  Stress: Not on file  Social Connections: Not on file  Intimate Partner Violence: Not on file   Family History  Problem Relation Age of Onset   Hypertension Mother    Diabetes Father    Asthma Maternal Grandmother    Diabetes Paternal Grandmother    Cancer Paternal Aunt        breast   Breast cancer Maternal Aunt    Migraines Neg Hx    Past Surgical History:  Procedure Laterality Date   NO PAST SURGERIES       Alexandra Layman, MD 01/11/23 1916

## 2023-01-12 LAB — URINE CULTURE: Culture: 10000 — AB

## 2023-03-15 ENCOUNTER — Ambulatory Visit: Payer: No Typology Code available for payment source

## 2023-03-30 NOTE — Addendum Note (Signed)
 Addended by: Narda Rutherford on: 03/30/2023 09:38 AM   Modules accepted: Orders

## 2023-04-05 ENCOUNTER — Ambulatory Visit
Admission: RE | Admit: 2023-04-05 | Discharge: 2023-04-05 | Disposition: A | Discharge status: Home / Self Care | Source: Ambulatory Visit | Attending: Obstetrics and Gynecology | Admitting: Obstetrics and Gynecology

## 2023-04-05 ENCOUNTER — Ambulatory Visit: Payer: Self-pay | Admitting: Hematology and Oncology

## 2023-04-05 VITALS — BP 118/78 | Wt 198.0 lb

## 2023-04-05 DIAGNOSIS — Z1231 Encounter for screening mammogram for malignant neoplasm of breast: Secondary | ICD-10-CM

## 2023-04-05 DIAGNOSIS — Z124 Encounter for screening for malignant neoplasm of cervix: Secondary | ICD-10-CM

## 2023-04-05 NOTE — Patient Instructions (Signed)
 Taught Alexandra Ford about self breast awareness and gave educational materials to take home. Patient did not need a Pap smear today due to last Pap smear was in 04/13/2017 per patient. Let her know [facility] will cover Pap smears every 5 years unless has a history of abnormal Pap smears. Referred patient to the [facility] for screening mammogram. Appointment scheduled for 04/05/2023. Patient aware of appointment and will be there. Let patient know will follow up with her within the next couple weeks with results. Alexandra Ford verbalized understanding.  Pascal Lux, NP 11:24 AM

## 2023-04-05 NOTE — Progress Notes (Signed)
 [name] is a 43 y.o. [phone] female who presents to [facility] clinic today with no complaints.    Pap Smear: Pap smear completed today. Last Pap smear was 04/13/2017 at [facility] clinic and was normal. Per patient has no history of an abnormal Pap smear. Last Pap smear result is available in Epic.   Physical exam: Breasts Breasts symmetrical. No skin abnormalities bilateral breasts. No nipple retraction bilateral breasts. No nipple discharge bilateral breasts. No lymphadenopathy. No lumps palpated bilateral breasts.       Pelvic/Bimanual Ext Genitalia No lesions, no swelling and no discharge observed on external genitalia.        Vagina Vagina pink and normal texture. No lesions or discharge observed in vagina.        Cervix Cervix is present. Cervix pink and of normal texture. No discharge observed.    Uterus Uterus is present and palpable. Uterus in normal position and normal size.        Adnexae Bilateral ovaries present and palpable. No tenderness on palpation.         Rectovaginal No rectal exam completed today since patient had no rectal complaints. No skin abnormalities observed on exam.     Smoking History: Patient has never smoked and was not referred to quit line.    Patient Navigation: Patient education provided. Access to services provided for patient through [facility] program. Natale Lay interpreter provided. No transportation provided   Colorectal Cancer Screening: Per patient has never had colonoscopy completed No complaints today.    Breast and Cervical Cancer Risk Assessment: Patient does not have family history of breast cancer, known genetic mutations, or radiation treatment to the chest before age 50. Patient does not have history of cervical dysplasia, immunocompromised, or DES exposure in-utero.  Risk Scores as of Encounter on 04/05/2023     Alexandra Ford           5-year 0.41%   Lifetime 7.2%            Last calculated by Caprice Red,  CMA on 04/05/2023 at 11:01 AM        A: [facility] exam with pap smear No complaints with benign exam.   P: Referred patient to the [facility] for a screening mammogram. Appointment scheduled 04/05/2023.  Alexandra Ford A, NP 04/05/2023 11:22 AM

## 2023-04-09 LAB — CYTOLOGY - PAP
Comment: NEGATIVE
Diagnosis: NEGATIVE
High risk HPV: NEGATIVE

## 2023-04-09 NOTE — Telephone Encounter (Signed)
 Opened in error

## 2023-07-29 IMAGING — US US PELVIS COMPLETE TRANSABD/TRANSVAG W DUPLEX AND/OR DOPPLER
1 series · 13 of 25 positions shown · non-contrast
Comparison: None available.

CLINICAL DATA: Initial evaluation for acute left lower quadrant
pain.

EXAM:
TRANSABDOMINAL AND TRANSVAGINAL ULTRASOUND OF PELVIS
DOPPLER ULTRASOUND OF OVARIES
TECHNIQUE: Both transabdominal and transvaginal ultrasound examinations of the
pelvis were performed. Transabdominal technique was performed for
global imaging of the pelvis including uterus, ovaries, adnexal
regions, and pelvic cul-de-sac.
It was necessary to proceed with endovaginal exam following the
transabdominal exam to visualize the endometrium and ovaries. Color
and duplex Doppler ultrasound was utilized to evaluate blood flow to
the ovaries.

[Series 1: us pelvic complete w transvaginal and torsion righ · 13 of 52 slices shown]
[im 1/52]
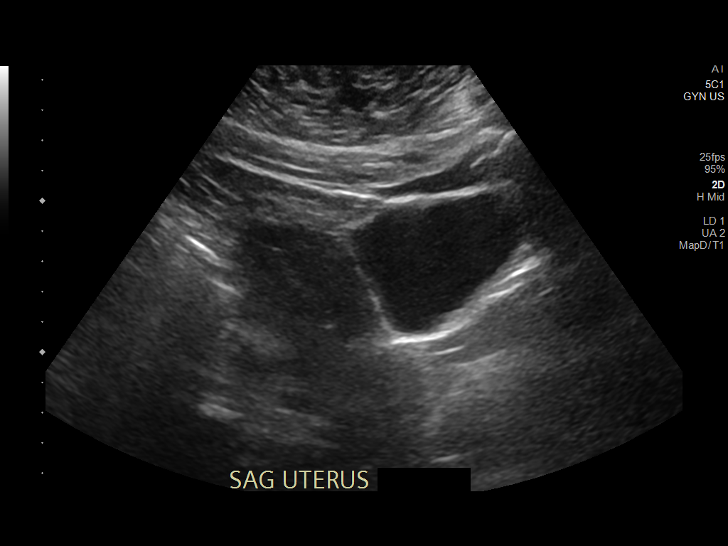
[im 5/52]
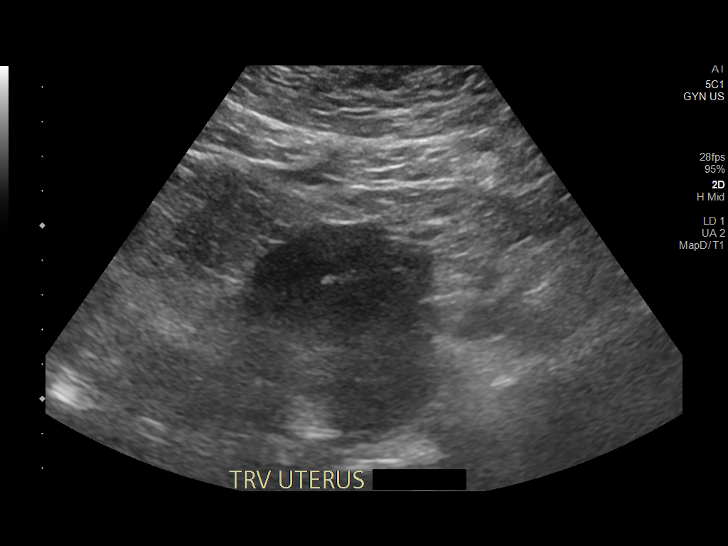
[im 9/52]
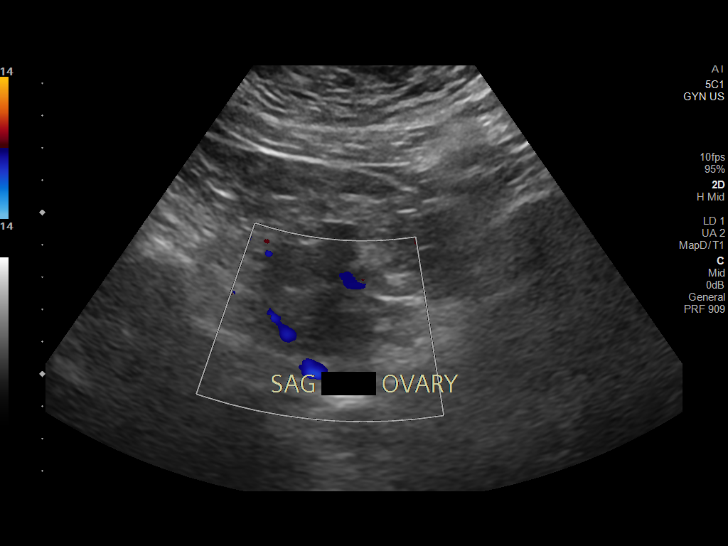
[im 13/52]
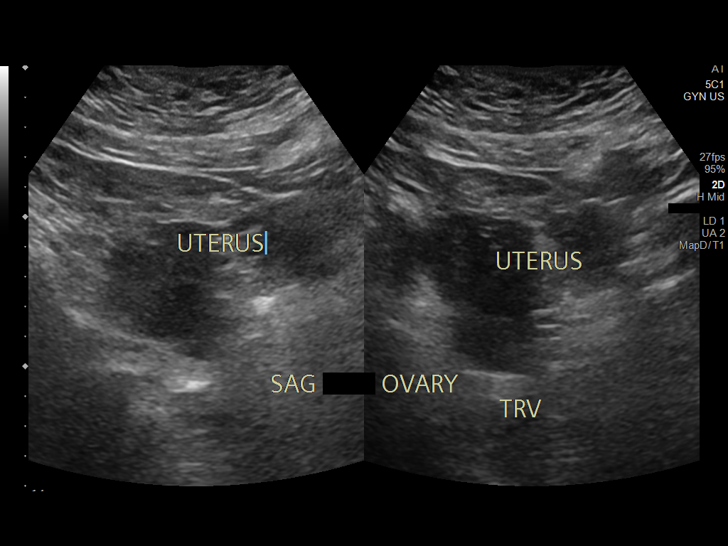
[im 18/52]
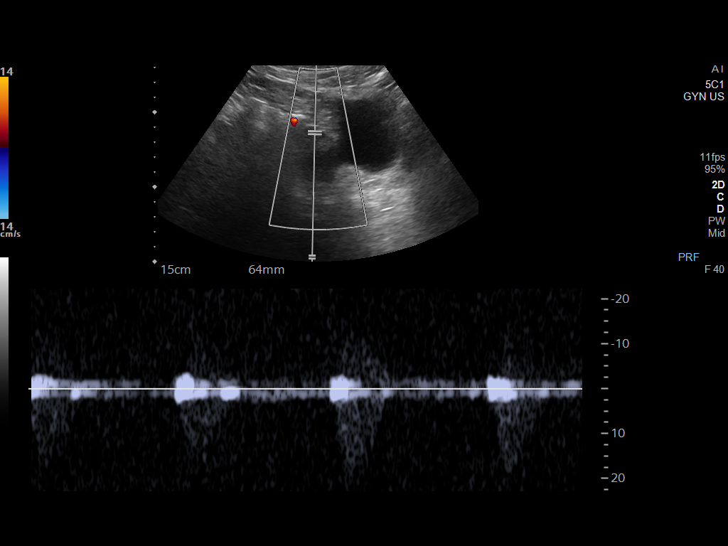
[im 22/52]
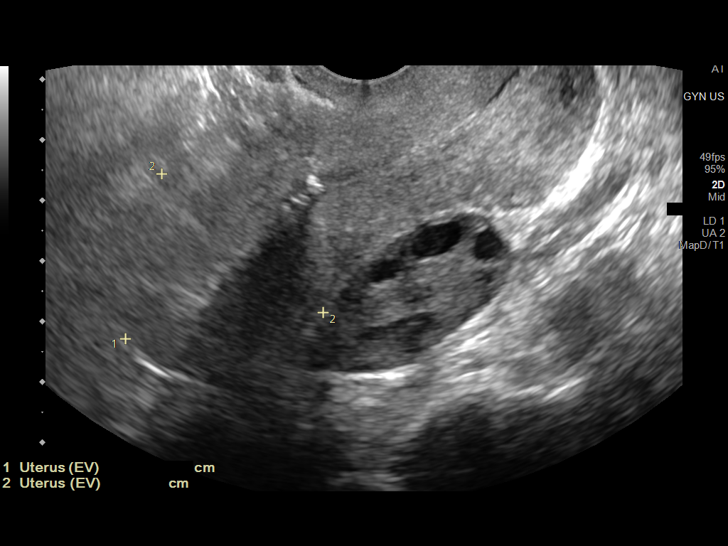
[im 26/52]
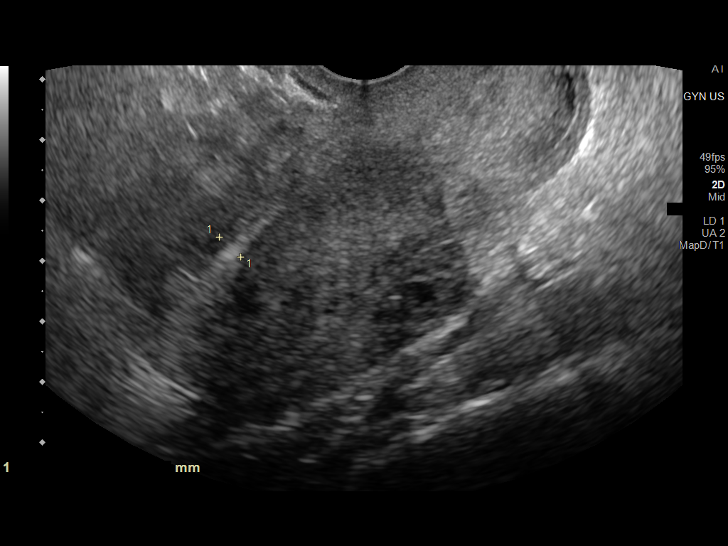
[im 30/52]
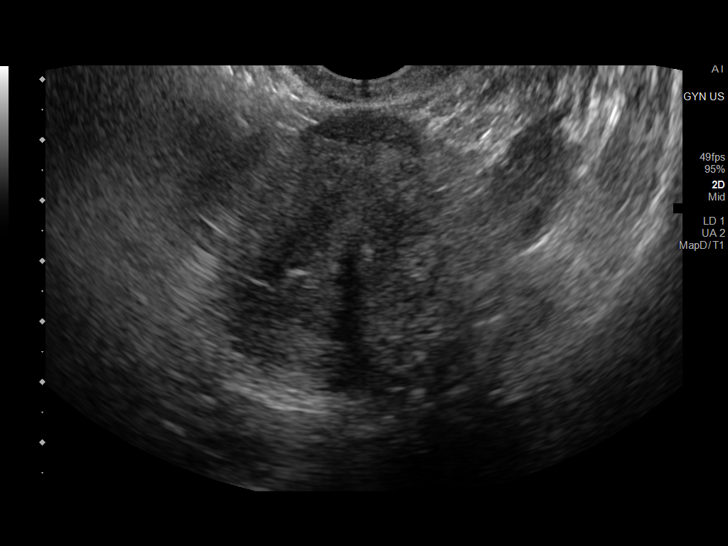
[im 35/52]
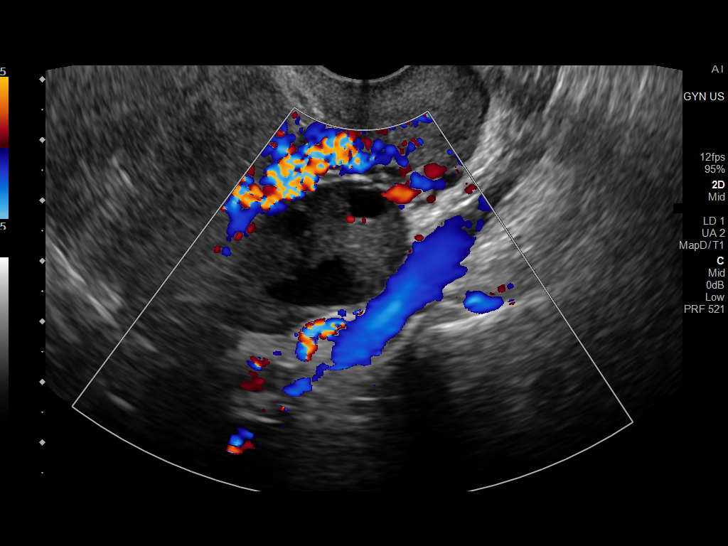
[im 39/52]
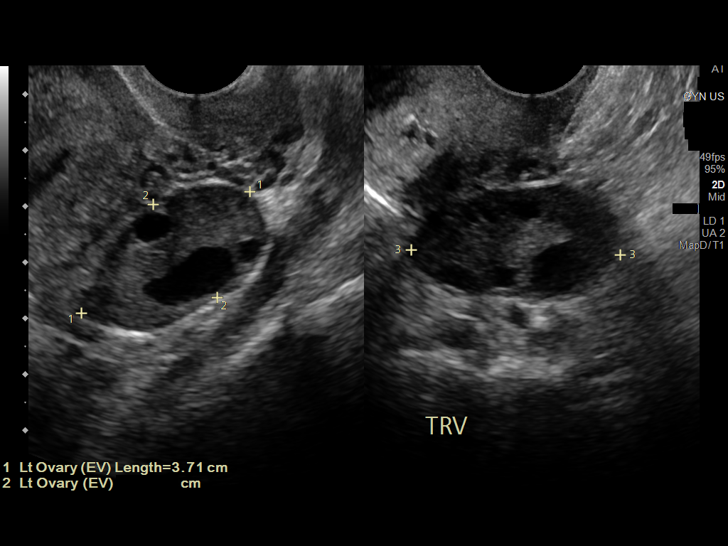
[im 43/52]
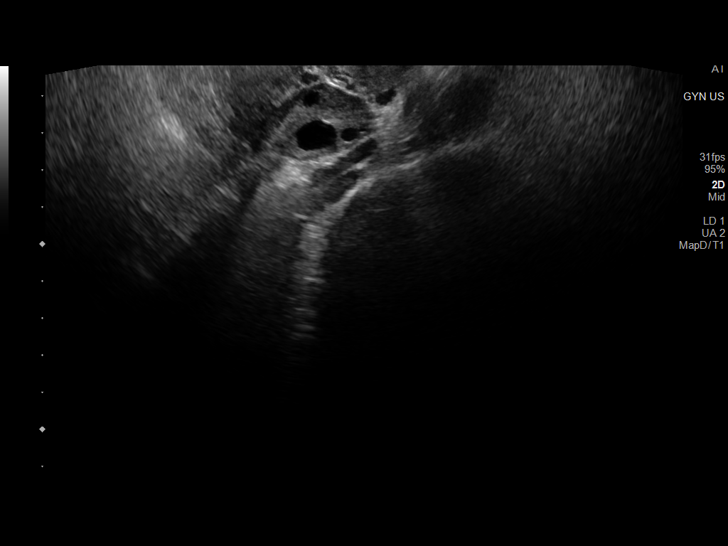
[im 47/52]
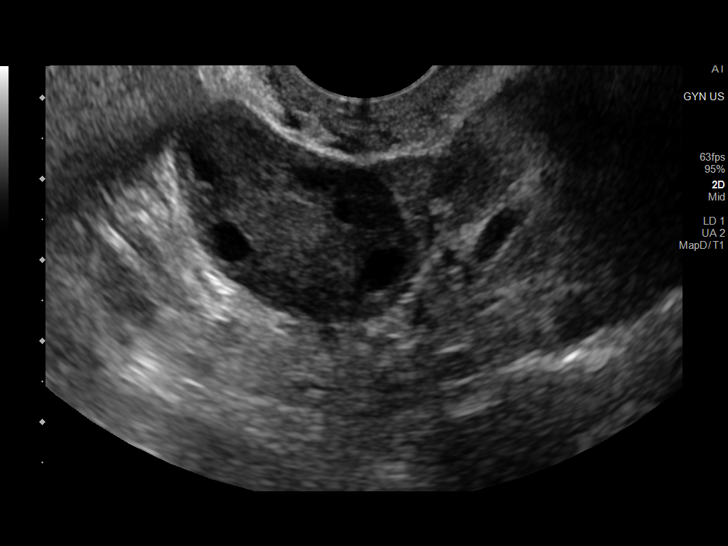
[im 52/52]
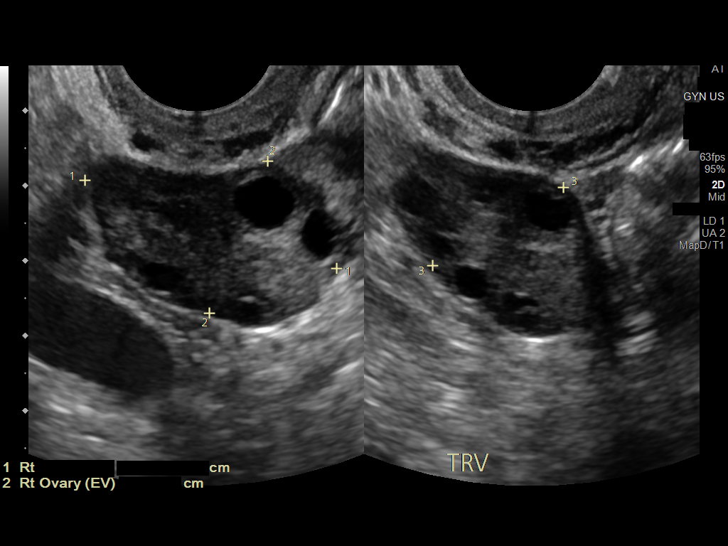

[13 of 25 positions shown; findings below may reference images not displayed]

FINDINGS: Uterus

Measurements: 8.9 x 3.5 x 4.3 cm = volume: 70.8 mL. Uterus is
anteverted. No discrete fibroid or other myometrial abnormality.

Endometrium

Thickness: 4.8 mm. No focal abnormality visualized. IUD
appropriately in place within the endometrial cavity.

Right ovary

Measurements: 3.6 x 2.2 x 2.0 cm = volume: 8.2 mL. Normal
appearance/no adnexal mass.

Left ovary

Measurements: 3.7 x 2.0 x 3.7 cm = volume: 14.6 mL. Normal
appearance/no adnexal mass.

Pulsed Doppler evaluation of both ovaries demonstrates normal
low-resistance arterial and venous waveforms.

Other findings

No abnormal free fluid.
IMPRESSION: 1. Normal pelvic ultrasound. No evidence for ovarian torsion or
other acute abnormality.
2. IUD appropriately in place within the endometrial cavity.

## 2023-12-08 ENCOUNTER — Ambulatory Visit (HOSPITAL_COMMUNITY)
Admission: EM | Admit: 2023-12-08 | Discharge: 2023-12-08 | Disposition: A | Discharge status: Home / Self Care | Payer: Self-pay

## 2023-12-08 ENCOUNTER — Encounter (HOSPITAL_COMMUNITY): Payer: Self-pay | Admitting: *Deleted

## 2023-12-08 ENCOUNTER — Ambulatory Visit (INDEPENDENT_AMBULATORY_CARE_PROVIDER_SITE_OTHER)

## 2023-12-08 DIAGNOSIS — M7532 Calcific tendinitis of left shoulder: Secondary | ICD-10-CM

## 2023-12-08 DIAGNOSIS — S161XXA Strain of muscle, fascia and tendon at neck level, initial encounter: Secondary | ICD-10-CM

## 2023-12-08 DIAGNOSIS — M25512 Pain in left shoulder: Secondary | ICD-10-CM

## 2023-12-08 DIAGNOSIS — M546 Pain in thoracic spine: Secondary | ICD-10-CM

## 2023-12-08 HISTORY — DX: Prediabetes: R73.03

## 2023-12-08 MED ORDER — KETOROLAC TROMETHAMINE 10 MG PO TABS: 10.0000 mg | ORAL_TABLET | Freq: Four times a day (QID) | ORAL | 0 refills | Status: AC | PRN

## 2023-12-08 MED ORDER — KETOROLAC TROMETHAMINE 30 MG/ML IJ SOLN
INTRAMUSCULAR | Status: AC
  Filled 2023-12-08: qty 1

## 2023-12-08 MED ORDER — BACLOFEN 10 MG PO TABS: 10.0000 mg | ORAL_TABLET | Freq: Three times a day (TID) | ORAL | 0 refills | Status: AC

## 2023-12-08 MED ORDER — KETOROLAC TROMETHAMINE 30 MG/ML IJ SOLN
30.0000 mg | Freq: Once | INTRAMUSCULAR | Status: AC
  Administered 2023-12-08: 30 mg via INTRAMUSCULAR

## 2023-12-08 NOTE — ED Triage Notes (Signed)
 Pt declines interpreter, states she wishes to use her son to translate. Reports bumping into a doorframe with left shoulder approx 1 month ago, and had some minor pain. STates since then pain has become progressively worse. C/O very painful and decreased ROM at left shoulder, and now having pain radiating into left posteriolateral neck and down into LUE. Denies parasthesias.

## 2023-12-08 NOTE — Discharge Instructions (Addendum)
 La radiografa no revel fracturas ni luxaciones. S revel indicios de tendinitis calcificada, una afeccin crnica que a veces puede causar dolor agudo. Hoy le administraron una inyeccin de Toradol  en la clnica para engineer, materials. Le recet tabletas adicionales de Toradol  que puede tomar cada 6 horas segn sea necesario. Tiene al menos 8 horas despus de la inyeccin para tomar ms. No tome este medicamento con otros AINE, como ibuprofeno, Motrin , Advil , Aleve o naproxeno. Tambin le recet baclofeno que puede tomar cada 8 horas segn sea necesario para chief technology officer y los espasmos musculares. Cada 6-8 horas segn sea necesario para el dolor irruptivo. Aplique hielo y calor alternativamente y realice estiramientos suaves para engineer, materials. Si el dolor persiste, consulte con el servicio de medicina deportiva de [facility] para ignacia evaluacin y [place].  Your x-ray did not reveal any fractures or dislocations.  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 You were given an injection of Toradol  in clinic today to help with your pain.  I have prescribed additional tablets of Toradol  that you can take every 6 hours as needed for pain.  We have at least 8 hours after receiving injection in clinic today to take more of this.  Do not take this with other NSAIDs including ibuprofen , Motrin , Advil , Aleve, and naproxen. I have also prescribed baclofen that you can take every 8 hours as needed for muscle pain and spasms. Every 6-8 hours as needed for breakthrough pain. Alternate between ice and heat and do some gentle stretching to help with pain. Follow-up with [place] [facility] if your pain continues for further evaluation and management.

## 2023-12-08 NOTE — ED Provider Notes (Signed)
 [facility]    CSN: [id number] Arrival date & time: 12/08/23  1407      History   Chief Complaint Chief Complaint  Patient presents with   Shoulder Pain    HPI Alexandra Ford is a 43 y.o. female.   Patient presents with left shoulder pain that began approximately 3 weeks ago.  Patient states that Alexandra Ford initially bumped into a door frame and had some mild pain at that time, but this has progressively worsened over the last 3 weeks.  Patient states that the pain is much more severe and worse with movement and now radiates up the left side of her neck and down her left arm.  Patient denies any numbness or tingling.  Patient does report some decreased range of motion of the shoulder due to the pain.  Patient denies taking any medication for her pain.  The history is provided by the patient and medical records. The history is limited by a language barrier. No language interpreter was used (Declined interpreter, patient's son is interpreting).  Shoulder Pain   Past Medical History:  Diagnosis Date   Gestational diabetes    Migraines    Pre-diabetes     Patient Active Problem List   Diagnosis Date Noted   Chronic migraine without aura without status migrainosus, not intractable 04/03/2019   Episodic cluster headache, not intractable 04/03/2019    Past Surgical History:  Procedure Laterality Date   NO PAST SURGERIES      OB History     Gravida  2   Para  2   Term  2   Preterm      AB      Living  2      SAB      IAB      Ectopic      Multiple      Live Births  2            Home Medications    Prior to Admission medications   Medication Sig Start Date End Date Taking? Authorizing Provider  baclofen (LIORESAL) 10 MG tablet Take 1 tablet (10 mg total) by mouth 3 (three) times daily. 12/08/23  Yes Johnie Flaming A, NP  BIOTIN PO Take by mouth.   Yes [provider]  Cholecalciferol (VITAMIN D-3 PO) Take by mouth.    Yes [provider]  ketorolac  (TORADOL ) 10 MG tablet Take 1 tablet (10 mg total) by mouth every 6 (six) hours as needed for moderate pain (pain score 4-6) or severe pain (pain score 7-10). 12/08/23  Yes Johnie Flaming A, NP  MAGNESIUM PO Take by mouth.   Yes [provider]  Multiple Vitamins-Minerals (ZINC PO) Take by mouth.   Yes [provider]  SUMAtriptan (IMITREX) 50 MG tablet Take 50 mg by mouth as directed.  04/03/19  [provider]    Family History Family History  Problem Relation Age of Onset   Hypertension Mother    Diabetes Father    Asthma Maternal Grandmother    Diabetes Paternal Grandmother    Breast cancer Maternal Aunt    Cancer Paternal Aunt        breast   Migraines Neg Hx     Social History Social History   Tobacco Use   Smoking status: Never   Smokeless tobacco: Never  Vaping Use   Vaping status: Never Used  Substance Use Topics   Alcohol use: No   Drug use: No  Allergies   Penicillins   Review of Systems Review of Systems  Per HPI  Physical Exam Triage Vital Signs ED Triage Vitals  Encounter Vitals Group     BP 12/08/23 1611 130/78     Girls Systolic BP Percentile --      Girls Diastolic BP Percentile --      Boys Systolic BP Percentile --      Boys Diastolic BP Percentile --      Pulse Rate 12/08/23 1611 73     Resp 12/08/23 1611 16     Temp 12/08/23 1611 99.1 F (37.3 C)     Temp Source 12/08/23 1611 Oral     SpO2 12/08/23 1611 98 %     Weight --      Height --      Head Circumference --      Peak Flow --      Pain Score 12/08/23 1612 10     Pain Loc --      Pain Education --      Exclude from Growth Chart --    No data found.  Updated Vital Signs BP 130/78   Pulse 73   Temp 99.1 F (37.3 C) (Oral)   Resp 16   SpO2 98%   Visual Acuity Right Eye Distance:   Left Eye Distance:   Bilateral Distance:    Right Eye Near:   Left Eye Near:    Bilateral Near:     Physical  Exam Vitals and nursing note reviewed.  Constitutional:      General: Alexandra Ford is awake. Alexandra Ford is not in acute distress.    Appearance: Normal appearance. Alexandra Ford is well-developed and well-groomed. Alexandra Ford is not ill-appearing.  Musculoskeletal:     Left shoulder: Tenderness present. No swelling or deformity. Decreased range of motion.     Cervical back: Tenderness present. No swelling or deformity. Pain with movement present. Decreased range of motion.     Thoracic back: Tenderness present. No swelling, edema, deformity, signs of trauma or bony tenderness. Decreased range of motion.     Comments: Diffuse tenderness noted to left shoulder, left paraspinal musculature, and left thoracic back with decreased range of motion secondary to pain.  Skin:    General: Skin is warm and dry.  Neurological:     Mental Status: Alexandra Ford is alert.  Psychiatric:        Behavior: Behavior is cooperative.      UC Treatments / Results  Labs (all labs ordered are listed, but only abnormal results are displayed) Labs Reviewed - No data to display  EKG   Radiology DG Shoulder Left Result Date: 12/08/2023 EXAM: 1 VIEW(S) XRAY OF THE LEFT SHOULDER 12/08/2023 04:50:32 PM COMPARISON: None available. CLINICAL HISTORY: left shoulder pain FINDINGS: BONES AND JOINTS: Glenohumeral joint is normally aligned. No acute fracture or dislocation. The [facility] joint is unremarkable in appearance. SOFT TISSUES: Multiple clustered ossific densities measuring up to 1.5 cm along the greater trochanter likely calcific tendinosis. Visualized lung is unremarkable. IMPRESSION: 1. No acute findings. 2. Multiple clustered ossific densities measuring up to 1.5 cm along the greater trochanter likely calcific tendinosis. Electronically signed by: Morgane Naveau MD 12/08/2023 05:00 PM EST RP Workstation: [id number]    Procedures Procedures (including critical care time)  Medications Ordered in UC Medications  ketorolac  (TORADOL ) 30 MG/ML injection 30 mg  (30 mg Intramuscular Given 12/08/23 1657)    Initial Impression / Assessment and Plan / UC Course  I have reviewed the triage  vital signs and the nursing notes.  Pertinent labs & imaging results that were available during my care of the patient were reviewed by me and considered in my medical decision making (see chart for details).     Patient is overall well-appearing but does appear to be in acute pain.  Vitals are stable.  Shoulder x-ray ordered.  I independently interpreted these images and there are no acute findings but there are signs of calcific tendinosis.  Discussed with patient that this could likely be contributing to her shoulder pain because of her ongoing shoulder pain Alexandra Ford now has muscular pain in her neck and upper back.  Given IM Toradol  in clinic for acute pain.  Prescribed additional Toradol  as needed for pain.  Prescribed baclofen as needed for muscle pain and spasms.  Recommended Tylenol  as needed for breakthrough pain.  Given orthopedic follow-up.  Discussed follow-up and return precautions. Final Clinical Impressions(s) / UC Diagnoses   Final diagnoses:  Acute pain of left shoulder  Strain of neck muscle, initial encounter  Acute left-sided thoracic back pain  Calcific tendinitis of left shoulder     Discharge Instructions      La radiografa no revel fracturas ni luxaciones. S revel indicios de tendinitis calcificada, una afeccin crnica que a veces puede causar dolor agudo. Hoy le administraron una inyeccin de Toradol  en la clnica para engineer, materials. Le recet tabletas adicionales de Toradol  que puede tomar cada 6 horas segn sea necesario. Tiene al menos 8 horas despus de la inyeccin para tomar ms. No tome este medicamento con otros AINE, como ibuprofeno, Motrin , Advil , Aleve o naproxeno. Tambin le recet baclofeno que puede tomar cada 8 horas segn sea necesario para chief technology officer y los espasmos musculares. Cada 6-8 horas segn sea necesario para el  dolor irruptivo. Aplique hielo y calor alternativamente y realice estiramientos suaves para engineer, materials. Si el dolor persiste, consulte con el servicio de medicina deportiva de [facility] para ignacia evaluacin y [place].  Your x-ray did not reveal any fractures or dislocations.  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 You were given an injection of Toradol  in clinic today to help with your pain.  I have prescribed additional tablets of Toradol  that you can take every 6 hours as needed for pain.  We have at least 8 hours after receiving injection in clinic today to take more of this.  Do not take this with other NSAIDs including ibuprofen , Motrin , Advil , Aleve, and naproxen. I have also prescribed baclofen that you can take every 8 hours as needed for muscle pain and spasms. Every 6-8 hours as needed for breakthrough pain. Alternate between ice and heat and do some gentle stretching to help with pain. Follow-up with [place] sports medicine if your pain continues for further evaluation and management.     ED Prescriptions     Medication Sig Dispense Auth. Provider   ketorolac  (TORADOL ) 10 MG tablet Take 1 tablet (10 mg total) by mouth every 6 (six) hours as needed for moderate pain (pain score 4-6) or severe pain (pain score 7-10). 20 tablet Johnie Flaming A, NP   baclofen (LIORESAL) 10 MG tablet Take 1 tablet (10 mg total) by mouth 3 (three) times daily. 30 each Johnie Flaming LABOR, NP      PDMP not reviewed this encounter.   Johnie Flaming A, NP 12/08/23 [phone]
